# Patient Record
Sex: Female | Born: 1995 | Hispanic: Yes | Smoking: Never smoker
Health system: Southern US, Community
[De-identification: ages and names within clinical notes are randomized; demographics above are authoritative.]

## PROBLEM LIST (undated history)

## (undated) ENCOUNTER — Inpatient Hospital Stay (HOSPITAL_COMMUNITY): Payer: Self-pay

## (undated) DIAGNOSIS — Z789 Other specified health status: Secondary | ICD-10-CM

## (undated) HISTORY — PX: NO PAST SURGERIES: SHX2092

---
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
## 2017-04-26 ENCOUNTER — Encounter (HOSPITAL_COMMUNITY): Payer: Self-pay | Admitting: *Deleted

## 2017-04-26 ENCOUNTER — Inpatient Hospital Stay (HOSPITAL_COMMUNITY)
Admission: AD | Admit: 2017-04-26 | Discharge: 2017-04-28 | DRG: 807 | Disposition: A | Discharge status: Home / Self Care | Payer: Medicaid Other | Source: Ambulatory Visit | Attending: Family Medicine | Admitting: Family Medicine

## 2017-04-26 ENCOUNTER — Other Ambulatory Visit: Payer: Self-pay

## 2017-04-26 DIAGNOSIS — D649 Anemia, unspecified: Secondary | ICD-10-CM | POA: Diagnosis present

## 2017-04-26 DIAGNOSIS — O9902 Anemia complicating childbirth: Principal | ICD-10-CM | POA: Diagnosis present

## 2017-04-26 DIAGNOSIS — Z3A38 38 weeks gestation of pregnancy: Secondary | ICD-10-CM

## 2017-04-26 DIAGNOSIS — Z3483 Encounter for supervision of other normal pregnancy, third trimester: Secondary | ICD-10-CM | POA: Diagnosis present

## 2017-04-26 HISTORY — DX: Other specified health status: Z78.9

## 2017-04-26 LAB — RPR: RPR Ser Ql: NONREACTIVE

## 2017-04-26 LAB — CBC
HCT: 38.4 % (ref 36.0–46.0)
Hemoglobin: 13.2 g/dL (ref 12.0–15.0)
MCH: 29.7 pg (ref 26.0–34.0)
MCHC: 34.4 g/dL (ref 30.0–36.0)
MCV: 86.5 fL (ref 78.0–100.0)
PLATELETS: 240 10*3/uL (ref 150–400)
RBC: 4.44 MIL/uL (ref 3.87–5.11)
RDW: 13.8 % (ref 11.5–15.5)
WBC: 12.6 10*3/uL — AB (ref 4.0–10.5)

## 2017-04-26 LAB — TYPE AND SCREEN
ABO/RH(D): O POS
ANTIBODY SCREEN: NEGATIVE

## 2017-04-26 LAB — ABO/RH: ABO/RH(D): O POS

## 2017-04-26 MED ORDER — ACETAMINOPHEN 325 MG PO TABS
650.0000 mg | ORAL_TABLET | ORAL | Status: DC | PRN
Start: 2017-04-26 — End: 2017-04-26

## 2017-04-26 MED ORDER — FENTANYL CITRATE (PF) 100 MCG/2ML IJ SOLN
100.0000 ug | INTRAMUSCULAR | Status: DC | PRN
  Administered 2017-04-26 (×2): 100 ug via INTRAVENOUS
  Filled 2017-04-26 (×2): qty 2

## 2017-04-26 MED ORDER — DIPHENHYDRAMINE HCL 25 MG PO CAPS: 25.0000 mg | ORAL_CAPSULE | Freq: Four times a day (QID) | ORAL | Status: DC | PRN

## 2017-04-26 MED ORDER — OXYCODONE-ACETAMINOPHEN 5-325 MG PO TABS: 2.0000 | ORAL_TABLET | ORAL | Status: DC | PRN

## 2017-04-26 MED ORDER — SENNOSIDES-DOCUSATE SODIUM 8.6-50 MG PO TABS
2.0000 | ORAL_TABLET | ORAL | Status: DC
  Administered 2017-04-26: 2 via ORAL
  Filled 2017-04-26 (×2): qty 2

## 2017-04-26 MED ORDER — LACTATED RINGERS IV SOLN
INTRAVENOUS | Status: DC
  Administered 2017-04-26: 05:00:00 via INTRAVENOUS

## 2017-04-26 MED ORDER — BENZOCAINE-MENTHOL 20-0.5 % EX AERO
1.0000 "application " | INHALATION_SPRAY | CUTANEOUS | Status: DC | PRN
  Administered 2017-04-26: 1 via TOPICAL
  Filled 2017-04-26: qty 56

## 2017-04-26 MED ORDER — SODIUM CHLORIDE 0.9% FLUSH: 3.0000 mL | Freq: Two times a day (BID) | INTRAVENOUS | Status: DC

## 2017-04-26 MED ORDER — ONDANSETRON HCL 4 MG/2ML IJ SOLN: 4.0000 mg | Freq: Four times a day (QID) | INTRAMUSCULAR | Status: DC | PRN

## 2017-04-26 MED ORDER — ONDANSETRON HCL 4 MG/2ML IJ SOLN: 4.0000 mg | INTRAMUSCULAR | Status: DC | PRN

## 2017-04-26 MED ORDER — WITCH HAZEL-GLYCERIN EX PADS: 1.0000 "application " | MEDICATED_PAD | CUTANEOUS | Status: DC | PRN

## 2017-04-26 MED ORDER — TETANUS-DIPHTH-ACELL PERTUSSIS 5-2.5-18.5 LF-MCG/0.5 IM SUSP: 0.5000 mL | Freq: Once | INTRAMUSCULAR | Status: DC

## 2017-04-26 MED ORDER — SIMETHICONE 80 MG PO CHEW: 80.0000 mg | CHEWABLE_TABLET | ORAL | Status: DC | PRN

## 2017-04-26 MED ORDER — ACETAMINOPHEN 325 MG PO TABS: 650.0000 mg | ORAL_TABLET | ORAL | Status: DC | PRN

## 2017-04-26 MED ORDER — OXYTOCIN 40 UNITS IN LACTATED RINGERS INFUSION - SIMPLE MED
2.5000 [IU]/h | INTRAVENOUS | Status: DC
  Filled 2017-04-26: qty 1000

## 2017-04-26 MED ORDER — ONDANSETRON HCL 4 MG PO TABS: 4.0000 mg | ORAL_TABLET | ORAL | Status: DC | PRN

## 2017-04-26 MED ORDER — MEASLES, MUMPS & RUBELLA VAC ~~LOC~~ INJ
0.5000 mL | INJECTION | Freq: Once | SUBCUTANEOUS | Status: DC
  Filled 2017-04-26: qty 0.5

## 2017-04-26 MED ORDER — ZOLPIDEM TARTRATE 5 MG PO TABS: 5.0000 mg | ORAL_TABLET | Freq: Every evening | ORAL | Status: DC | PRN

## 2017-04-26 MED ORDER — LIDOCAINE HCL (PF) 1 % IJ SOLN
30.0000 mL | INTRAMUSCULAR | Status: AC | PRN
  Administered 2017-04-26: 30 mL via SUBCUTANEOUS
  Filled 2017-04-26: qty 30

## 2017-04-26 MED ORDER — OXYCODONE-ACETAMINOPHEN 5-325 MG PO TABS
1.0000 | ORAL_TABLET | ORAL | Status: DC | PRN
Start: 2017-04-26 — End: 2017-04-26

## 2017-04-26 MED ORDER — PRENATAL MULTIVITAMIN CH
1.0000 | ORAL_TABLET | Freq: Every day | ORAL | Status: DC
  Administered 2017-04-27 – 2017-04-28 (×2): 1 via ORAL
  Filled 2017-04-26 (×2): qty 1

## 2017-04-26 MED ORDER — OXYTOCIN BOLUS FROM INFUSION
500.0000 mL | Freq: Once | INTRAVENOUS | Status: AC
  Administered 2017-04-26: 500 mL via INTRAVENOUS

## 2017-04-26 MED ORDER — COCONUT OIL OIL: 1.0000 "application " | TOPICAL_OIL | Status: DC | PRN

## 2017-04-26 MED ORDER — IBUPROFEN 600 MG PO TABS
600.0000 mg | ORAL_TABLET | Freq: Four times a day (QID) | ORAL | Status: DC
  Administered 2017-04-26 – 2017-04-28 (×8): 600 mg via ORAL
  Filled 2017-04-26 (×8): qty 1

## 2017-04-26 MED ORDER — LACTATED RINGERS IV SOLN: 500.0000 mL | INTRAVENOUS | Status: DC | PRN

## 2017-04-26 MED ORDER — SODIUM CHLORIDE 0.9 % IV SOLN: 250.0000 mL | INTRAVENOUS | Status: DC | PRN

## 2017-04-26 MED ORDER — SOD CITRATE-CITRIC ACID 500-334 MG/5ML PO SOLN: 30.0000 mL | ORAL | Status: DC | PRN

## 2017-04-26 MED ORDER — SODIUM CHLORIDE 0.9% FLUSH: 3.0000 mL | INTRAVENOUS | Status: DC | PRN

## 2017-04-26 MED ORDER — FLEET ENEMA 7-19 GM/118ML RE ENEM: 1.0000 | ENEMA | RECTAL | Status: DC | PRN

## 2017-04-26 MED ORDER — DIBUCAINE 1 % RE OINT: 1.0000 "application " | TOPICAL_OINTMENT | RECTAL | Status: DC | PRN

## 2017-04-26 NOTE — H&P (Signed)
LABOR AND DELIVERY ADMISSION HISTORY AND PHYSICAL NOTE  Belinda Fisher is a 21 y.o. female G1P0 with IUP at 7051w6d by U/S presenting for SOL. Pt feeling ctx 12am. She reports positive fetal movement. She denies leakage of fluid or vaginal bleeding. Pt denies headache, blurry vision, dizziness RUQ pain or leg swelling.  Prenatal History/Complications: [facility] Pregnancy complications:  -Late PNC -Mild anemia  Past Medical History: Past Medical History:  Diagnosis Date  . Medical history non-contributory     Past Surgical History: Past Surgical History:  Procedure Laterality Date  . NO PAST SURGERIES      Obstetrical History: OB History    Gravida Para Term Preterm AB Living   1             SAB TAB Ectopic Multiple Live Births                  Social History: Social History   Socioeconomic History  . Marital status: Single    Spouse name: None  . Number of children: None  . Years of education: None  . Highest education level: None  Social Needs  . Financial resource strain: None  . Food insecurity - worry: None  . Food insecurity - inability: None  . Transportation needs - medical: None  . Transportation needs - non-medical: None  Occupational History  . None  Tobacco Use  . Smoking status: Never Smoker  . Smokeless tobacco: Never Used  Substance and Sexual Activity  . Alcohol use: No  . Drug use: No  . Sexual activity: Yes    Birth control/protection: Condom  Other Topics Concern  . 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 No pertinent family history.  Allergies: No Known Allergies  Medications Prior to Admission  Medication Sig Dispense Refill Last Dose  . ferrous sulfate 325 (65 FE) MG EC tablet Take 325 mg 3 (three) times daily with meals by mouth.   04/25/2017 at Unknown time  . Prenatal Vit-Fe Fumarate-FA  (PRENATAL MULTIVITAMIN) TABS tablet Take daily at 12 noon by mouth.        Review of Systems  All systems reviewed and negative except as stated in HPI  Physical Exam Blood pressure (!) 106/59, pulse 65, temperature (!) 97.4 F (36.3 C), resp. rate 16, height 4\' 11"  (1.499 m), weight 58.1 kg (128 lb), last menstrual period 07/20/2016. General appearance: alert, cooperative and no distress Lungs: clear to auscultation bilaterally Heart: regular rate and rhythm Abdomen: soft, non-tender; bowel sounds normal Extremities: No calf swelling or tenderness Presentation: cephalic Fetal monitoring: 135 baseline/ moderate variability/ + accels, no decels Uterine activity: irregular, every 3-5 min Dilation: 5 Effacement (%): 80 Station: -2 Exam by:: Quintella BatonJo Barham RNC  Prenatal labs: ABO, Rh: --/--/O POS (03/13 0124) Antibody:  Neg Rubella:  Immuned RPR:   Non reactive HBsAg:   Non reactive HIV:  Non reactive GC/Chlamydia: Neg GBS:  Neg 1 hr Glucola: 89 Genetic screening:  Declined Anatomy US: 22.1, EFW 504 gm 53%, anterior placenta no previa, f/u PRN   Prenatal Transfer Tool  Maternal Diabetes: No Genetic Screening: Declined Maternal Ultrasounds/Referrals: Normal Fetal Ultrasounds or other Referrals:  None Maternal Substance Abuse:  No Significant Maternal Medications:  None Significant Maternal Lab Results: Lab values include: Group B Strep negative  Results for orders placed or performed  during the hospital encounter of 04/26/17 (from the past 24 hour(s))  CBC   Collection Time: 04/26/17  5:15 AM  Result Value Ref Range   WBC 12.6 (H) 4.0 - 10.5 K/uL   RBC 4.44 3.87 - 5.11 MIL/uL   Hemoglobin 13.2 12.0 - 15.0 g/dL   HCT 40.9 81.1 - 91.4 %   MCV 86.5 78.0 - 100.0 fL   MCH 29.7 26.0 - 34.0 pg   MCHC 34.4 30.0 - 36.0 g/dL   RDW 78.2 95.6 - 21.3 %   Platelets 240 150 - 400 K/uL    There are no active problems to display for this patient.   Assessment: Belinda Fisher is a 21 y.o. G1P0 at [redacted]w[redacted]d here for SOL.   #Labor: Expectant management. Progressing normally.  #Pain: Per pt request. Wants natural birth #FWB: Cat 1 #ID:  GBS neg #MOF: breast #MOC: None, previously waned nexplanon #Circ:  No  Steffanie Rainwater 04/26/2017, 5:01 AM Medical Student  OB FELLOW HISTORY AND PHYSICAL ATTESTATION  I confirm that I have verified the information documented in the medical student's note and that I have also personally reperformed the physical exam and all medical decision making activities. I agree with above documentation and have made edits as needed.   Caryl Ada, DO OB Fellow 04/26/2017, 6:30 AM

## 2017-04-26 NOTE — Progress Notes (Signed)
To 165 via w/c

## 2017-04-26 NOTE — Progress Notes (Signed)
Dr Doroteo GlassmanPhelps updated as to repeat sve, ctx pattern, pt breathing with ctx. Will obs and hour and reck. Pt aware

## 2017-04-26 NOTE — MAU Note (Signed)
Ctxs since 12mn. No LOF. Care at [facility] but moved in with my mom Sat due to troubles with my husband

## 2017-04-26 NOTE — Progress Notes (Signed)
Dr Doroteo GlassmanPhelps notifed of pt's repeat sve and pt more uncomfortable. Will admit to BS.

## 2017-04-26 NOTE — Progress Notes (Signed)
Dr Doroteo GlassmanPhelps notified of pt's admission and status. Aware of ctx pattern, reactive fhr, sve. WIll watch and reck

## 2017-04-27 MED ORDER — LIDOCAINE HCL 1 % IJ SOLN
0.0000 mL | Freq: Once | INTRAMUSCULAR | Status: DC | PRN
  Filled 2017-04-27: qty 20

## 2017-04-27 MED ORDER — ETONOGESTREL 68 MG ~~LOC~~ IMPL
68.0000 mg | DRUG_IMPLANT | Freq: Once | SUBCUTANEOUS | Status: DC
  Filled 2017-04-27: qty 1

## 2017-04-27 NOTE — Progress Notes (Signed)
Post Partum Day 1 Subjective: no complaints, up ad lib, voiding, tolerating PO and + flatus  Objective: Blood pressure (!) 97/51, pulse 71, temperature 98 F (36.7 C), temperature source Oral, resp. rate 20, height 4\' 11"  (1.499 m), weight 58.1 kg (128 lb), last menstrual period 07/20/2016, SpO2 99 %.  Physical Exam:  General: alert, cooperative and no distress Lochia: appropriate Uterine Fundus: firm at umbilicus Incision: NA DVT Evaluation: No evidence of DVT seen on physical exam.  Recent Labs    04/26/17 0515  HGB 13.2  HCT 38.4    Assessment/Plan: Plan for discharge tomorrow, Breastfeeding and Contraception (Interested in [place]ex[facility]. Would like to discuss having it done here prior to discharege if possible). Needs assistance with finding pediatrician in [place]. [place] live with [place] in [place]. Denies plans for circumcision.    LOS: 1 day   Huel CoteBrian W [facility] 04/27/2017, 7:37 AM

## 2017-04-27 NOTE — Lactation Note (Addendum)
This note was copied from a baby's chart. Lactation Consultation Note  Patient Name: Belinda Fisher ZOXWR'UToday's Date: 04/27/2017 Reason for consult: Initial assessment   P1, Baby 22 hours old.   Mother's nipples invert when compressed.  Mother is able to easily express flow of colostrum. She recently pumped approx 6 ml of colostrum.  Discussed milk storage. Had mother prepump.  Attempted latching in football hold on L breast but baby did not achieve sufficient depth and did not suck. Mother applied #20NS.  LC prefilled with colostrum and baby began to suck.  Repeated when baby unlatched. Demonstrated how to give the remaining colostrum via finger syringe. Repositioned baby on R breast supporting with pillows.  Attempted first without NS but then mother applied #20NS. Baby sustained latch for approx 35 min with intermittent sucks and swallows. Encouraged mother to compress breast during feeding. Mom made aware of O/P services, breastfeeding support groups, community resources, and our phone # for post-discharge questions.    Plan is for mother to prepump and attempt latching.  If he does not latch, apply NS.  Prefill if breastmilk available. Post pump 4-5 times per day for 10-20 min.  Give volume back to baby at next feeding. Mom encouraged to feed baby 8-12 times/24 hours and with feeding cues. Unwrap and place STS after 3 hours of not feeding. Mother plans to continue to wear shells when not breastfeeding     Maternal Data Has patient been taught Hand Expression?: Yes Does the patient have breastfeeding experience prior to this delivery?: No  Feeding Feeding Type: Breast Fed Length of feed: 4 min(no sustained latch)  LATCH Score Latch: Repeated attempts needed to sustain latch, nipple held in mouth throughout feeding, stimulation needed to elicit sucking reflex.  Audible Swallowing: A few with stimulation  Type of Nipple: Flat  Comfort (Breast/Nipple): Soft /  non-tender  Hold (Positioning): Assistance needed to correctly position infant at breast and maintain latch.  LATCH Score: 6  Interventions Interventions: Breast feeding basics reviewed;Assisted with latch;Skin to skin;Hand express;Pre-pump if needed;Breast compression;Support pillows;Adjust position;Expressed milk;Hand pump;DEBP  Lactation Tools Discussed/Used Tools: Nipple Shields;Pump;Shells Nipple shield size: 20 Shell Type: Inverted Breast pump type: Double-Electric Breast Pump;Manual Pump Review: Setup, frequency, and cleaning;Milk Storage Initiated by:: [email or web address] Date initiated:: 04/27/17   Consult Status Consult Status: Follow-up Date: 04/28/17 Follow-up type: In-patient    Belinda Fisher, Belinda Fisher Belinda Fisher 04/27/2017, 10:27 AM

## 2017-04-28 ENCOUNTER — Encounter (HOSPITAL_COMMUNITY): Payer: Self-pay | Admitting: *Deleted

## 2017-04-28 MED ORDER — IBUPROFEN 600 MG PO TABS: 600.0000 mg | ORAL_TABLET | Freq: Four times a day (QID) | ORAL | 0 refills | Status: DC

## 2017-04-28 NOTE — Discharge Instructions (Signed)
Postpartum Care After Vaginal Delivery °The period of time right after you deliver your newborn is called the postpartum period. °What kind of medical care will I receive? °· You may continue to receive fluids and medicines through an IV tube inserted into one of your veins. °· If an incision was made near your vagina (episiotomy) or if you had some vaginal tearing during delivery, cold compresses may be placed on your episiotomy or your tear. This helps to reduce pain and swelling. °· You may be given a squirt bottle to use when you go to the bathroom. You may use this until you are comfortable wiping as usual. To use the squirt bottle, follow these steps: °? Before you urinate, fill the squirt bottle with warm water. Do not use hot water. °? After you urinate, while you are sitting on the toilet, use the squirt bottle to rinse the area around your urethra and vaginal opening. This rinses away any urine and blood. °? You may do this instead of wiping. As you start healing, you may use the squirt bottle before wiping yourself. Make sure to wipe gently. °? Fill the squirt bottle with clean water every time you use the bathroom. °· You will be given sanitary pads to wear. °How can I expect to feel? °· You may not feel the need to urinate for several hours after delivery. °· You will have some soreness and pain in your abdomen and vagina. °· If you are breastfeeding, you may have uterine contractions every time you breastfeed for up to several weeks postpartum. Uterine contractions help your uterus return to its normal size. °· It is normal to have vaginal bleeding (lochia) after delivery. The amount and appearance of lochia is often similar to a menstrual period in the first week after delivery. It will gradually decrease over the next few weeks to a dry, yellow-brown discharge. For most women, lochia stops completely by 6-8 weeks after delivery. Vaginal bleeding can vary from woman to woman. °· Within the first few  days after delivery, you may have breast engorgement. This is when your breasts feel heavy, full, and uncomfortable. Your breasts may also throb and feel hard, tightly stretched, warm, and tender. After this occurs, you may have milk leaking from your breasts. Your health care provider can help you relieve discomfort due to breast engorgement. Breast engorgement should go away within a few days. °· You may feel more sad or worried than normal due to hormonal changes after delivery. These feelings should not last more than a few days. If these feelings do not go away after several days, speak with your health care provider. °How should I care for myself? °· Tell your health care provider if you have pain or discomfort. °· Drink enough water to keep your urine clear or pale yellow. °· Wash your hands thoroughly with soap and water for at least 20 seconds after changing your sanitary pads, after using the toilet, and before holding or feeding your baby. °· If you are not breastfeeding, avoid touching your breasts a lot. Doing this can make your breasts produce more milk. °· If you become weak or lightheaded, or you feel like you might faint, ask for help before: °? Getting out of bed. °? Showering. °· Change your sanitary pads frequently. Watch for any changes in your flow, such as a sudden increase in volume, a change in color, the passing of large blood clots. If you pass a blood clot from your vagina, save it   to show to your health care provider. Do not flush blood clots down the toilet without having your health care provider look at them. °· Make sure that all your vaccinations are up to date. This can help protect you and your baby from getting certain diseases. You may need to have immunizations done before you leave the hospital. °· If desired, talk with your health care provider about methods of family planning or birth control (contraception). °How can I start bonding with my baby? °Spending as much time as  possible with your baby is very important. During this time, you and your baby can get to know each other and develop a bond. Having your baby stay with you in your room (rooming in) can give you time to get to know your baby. Rooming in can also help you become comfortable caring for your baby. Breastfeeding can also help you bond with your baby. °How can I plan for returning home with my baby? °· Make sure that you have a car seat installed in your vehicle. °? Your car seat should be checked by a certified car seat installer to make sure that it is installed safely. °? Make sure that your baby fits into the car seat safely. °· Ask your health care provider any questions you have about caring for yourself or your baby. Make sure that you are able to contact your health care provider with any questions after leaving the hospital. °This information is not intended to replace advice given to you by your health care provider. Make sure you discuss any questions you have with your health care provider. °Document Released: 04/06/2007 Document Revised: 11/12/2015 Document Reviewed: 05/14/2015 °Elsevier Interactive Patient Education © 2018 Elsevier Inc. ° ° °Contraception Choices °Contraception, also called birth control, means things to use or ways to try not to get pregnant. °Hormonal birth control °This kind of birth control uses hormones. Here are some types of hormonal birth control: °· A tube that is put under skin of the arm (implant). The tube can stay in for as long as 3 years. °· Shots to get every 3 months (injections). °· Pills to take every day (birth control pills). °· A patch to change 1 time each week for 3 weeks (birth control patch). After that, the patch is taken off for 1 week. °· A ring to put in the vagina. The ring is left in for 3 weeks. Then it is taken out of the vagina for 1 week. Then a new ring is put in. °· Pills to take after unprotected sex (emergency birth control pills). ° °Barrier birth  control °Here are some types of barrier birth control: °· A thin covering that is put on the penis before sex (female condom). The covering is thrown away after sex. °· A soft, loose covering that is put in the vagina before sex (female condom). The covering is thrown away after sex. °· A rubber bowl that sits over the cervix (diaphragm). The bowl must be made for you. The bowl is put into the vagina before sex. The bowl is left in for 6-8 hours after sex. It is taken out within 24 hours. °· A small, soft cup that fits over the cervix (cervical cap). The cup must be made for you. The cup can be left in for 6-8 hours after sex. It is taken out within 48 hours. °· A sponge that is put into the vagina before sex. It must be left in for at least 6   hours after sex. It must be taken out within 30 hours. Then it is thrown away.  A chemical that kills or stops sperm from getting into the uterus (spermicide). It may be a pill, cream, jelly, or foam to put in the vagina. The chemical should be used at least 10-15 minutes before sex.  IUD (intrauterine) birth control An IUD is a small, T-shaped piece of plastic. It is put inside the uterus. There are two kinds:  Hormone IUD. This kind can stay in for 3-5 years.  Copper IUD. This kind can stay in for 10 years.  Permanent birth control Here are some types of permanent birth control:  Surgery to block the fallopian tubes.  Having an insert put into each fallopian tube.  Surgery to tie off the tubes that carry sperm (vasectomy).  Natural planning birth control Here are some types of natural planning birth control:  Not having sex on the days the woman could get pregnant.  Using a calendar: ? To keep track of the length of each period. ? To find out what days pregnancy can happen. ? To plan to not have sex on days when pregnancy can happen.  Watching for symptoms of ovulation and not having sex during ovulation. One way the woman can check for ovulation  is to check her temperature.  Waiting to have sex until after ovulation.  Summary  Contraception, also called birth control, means things to use or ways to try not to get pregnant.  Hormonal methods of birth control include implants, injections, pills, patches, vaginal rings, and emergency birth control pills.  Barrier methods of birth control can include female condoms, female condoms, diaphragms, cervical caps, sponges, and spermicides.  There are two types of IUD (intrauterine device) birth control. An IUD can be put in a woman's uterus to prevent pregnancy for 3-5 years.  Permanent sterilization can be done through a procedure for males, females, or both.  Natural planning methods involve not having sex on the days when the woman could get pregnant. This information is not intended to replace advice given to you by your health care provider. Make sure you discuss any questions you have with your health care provider. Document Released: 04/06/2009 Document Revised: 06/19/2016 Document Reviewed: 06/19/2016 Elsevier Interactive Patient Education  2017 [employer]

## 2017-04-28 NOTE — Discharge Summary (Signed)
OB Discharge Summary     Patient Name: Belinda Fisher DOB: 06/20/1996 MRN: [id number]  Date of admission: 04/26/2017 Delivering MD: Wyvonnia DuskyLAWSON, MARIE D   Date of discharge: 04/28/2017  Admitting diagnosis: ctx every 5 min  Intrauterine pregnancy: 5166w1d     Secondary diagnosis:  Principal Problem:   SVD (spontaneous vaginal delivery) Active Problems:   Normal labor  Additional problems: none     Discharge diagnosis: Term Pregnancy Delivered                                                                                                Post partum procedures:none  Augmentation: AROM  Complications: None  Hospital course:  Onset of Labor With Vaginal Delivery     21 y.o. yo G1P0 at 5666w1d was admitted in Active Labor on 04/26/2017. Patient had an uncomplicated labor course as follows:  Membrane Rupture Time/Date: 7:32 AM ,04/26/2017   Intrapartum Procedures: Episiotomy: None [1]                                         Lacerations:  2nd degree [3];Perineal [11]  Patient had a delivery of a Viable infant. 04/26/2017  Information for the patient's newborn:  Belinda Fisher, Boy [name] [[id number]][[id number]]  Delivery Method: Vaginal, Spontaneous(Filed from Delivery Summary)    Pateint had an uncomplicated postpartum course.  She is ambulating, tolerating a regular diet, passing flatus, and urinating well. Patient is discharged home in stable condition on 04/28/17.   Physical exam  Vitals:   04/26/17 1800 04/27/17 0630 04/27/17 1840 04/28/17 0618  BP: (!) 93/51 (!) 97/51 (!) 104/55 (!) 93/54  Pulse: 71 71  70  Resp: 18 20 18 18   Temp: 98.2 F (36.8 C) 98 F (36.7 C) 98.6 F (37 C) 97.8 F (36.6 C)  TempSrc: Oral Oral Oral Oral  SpO2: 99%  98%   Weight:      Height:       General: alert, cooperative and no distress Lochia: appropriate Uterine Fundus: firm Incision: N/A DVT Evaluation: No evidence of DVT seen on physical exam. Labs: Lab Results  Component Value Date   WBC  12.6 (H) 04/26/2017   HGB 13.2 04/26/2017   HCT 38.4 04/26/2017   MCV 86.5 04/26/2017   PLT 240 04/26/2017   CMP Latest Ref Rng & Units 09/01/2016  Glucose 65 - 99 mg/dL 93  BUN 6 - 20 mg/dL 12  Creatinine 2.440.44 - 0.101.00 mg/dL 2.720.62  Sodium 536135 - 644145 mmol/L 136  Potassium 3.5 - 5.1 mmol/L 4.1  Chloride 101 - 111 mmol/L 105  CO2 22 - 32 mmol/L 24  Calcium 8.9 - 10.3 mg/dL 8.9  Total Protein 6.5 - 8.1 g/dL 6.8  Total Bilirubin 0.3 - 1.2 mg/dL 0.4  Alkaline Phos 38 - 126 U/L 76  AST 15 - 41 U/L 20  ALT 14 - 54 U/L 15    Discharge instruction: per After Visit Summary and "Baby and Me Booklet".  After visit meds:  Allergies as of 04/28/2017   No Known Allergies     Medication List    STOP taking these medications   ferrous sulfate 325 (65 FE) MG EC tablet     TAKE these medications   ibuprofen 600 MG tablet Commonly known as:  ADVIL,MOTRIN Take 1 tablet (600 mg total) every 6 (six) hours by mouth.   prenatal multivitamin Tabs tablet Take daily at 12 noon by mouth.       Diet: routine diet  Activity: Advance as tolerated. Pelvic rest for 6 weeks.   Outpatient follow up: 4 weeks postpartum [facility], but would like to follow up with us. [facility]: Please schedule this patient for PP visit in: 4 weeks Low risk pregnancy complicated by: teen pregnancy Delivery mode:  SVD Anticipated Birth Control:  other/unsure PP Procedures needed: none  Schedule Integrated BH visit: no Provider: any provider   Postpartum contraception: None -- counseled extensively and handouts given.   Newborn Data: Live born female  Birth Weight: 7 lb 3.2 oz (3265 g) APGAR: 9, 9  Newborn Delivery   Birth date/time:  04/26/2017 10:59:00 Delivery type:  Vaginal, Spontaneous     Baby Feeding: Breast Disposition:home with mother   04/28/2017 Belinda PearJulie P Degele, MD

## 2017-04-28 NOTE — Lactation Note (Signed)
This note was copied from a baby's chart. Lactation Consultation Note  Patient Name: Belinda Fisher ZOXWR'UToday's Date: 04/28/2017 Reason for consult: Follow-up assessment;Other (Comment)(WIC rep in the room with LC has  DEBP ) LC has visited mom x2 this am. Both times has fed in the 30 - 60 mins [facility] to [facility] , unable to assess feeding.  Per mom feels comfortable with application of the Nipple Shield and the pumping. Baby recently fed and mom has pumped Off earlier 30 ml.  [facility] using [facility] and the importance of feeding the abby with feeding cues and using a nipple Shield For latch due to Flat nipples, also encouraged the use of breast shells between feedings, and extra post pumping.  Mom will have a hand pump and LC suggested if to full to hand express or use the hand pump to release so NS will fit properly.  Post pump after every feeding for 10 - 15 mins. Save milk.  LC discussed nutritive vs non- nutritive feeding patterns and the importance of STS feedings until the baby can stay awake for a feeding  And the weight is back up to birth weight.  Sore nipple and engorgement prevention and tx reviewed with mom. And the importance of softening the 1st breast well prior to offering the  2nd breast. Mom will have a hand pump and a DEBP [facility].  LC offered mom and LC O/P appt. And recommended returning for appt. This Thursday or Friday and the OB / GYN clinic would call mom  To set up the appt. And the [facility] days.    Maternal Data    Feeding Feeding Type: (per mom baby recently breast fed ) Length of feed: 30 min  LATCH Score                   Interventions Interventions: Breast feeding basics reviewed  Lactation Tools Discussed/Used Tools: Nipple Shields Nipple shield size: 20 Shell Type: Inverted Breast pump type: Double-Electric Breast Pump;Manual WIC Program: Yes(per mom Belinda Fisher - [facility] [employer]  , )   Consult Status Consult Status: Follow-up Date: (LC placed a LC O/P request for LC appt. for this Thurday or Friday ) Follow-up type: Out-patient    Belinda SprangMargaret Ann Jalexa Fisher 04/28/2017, 12:34 PM

## 2017-04-28 NOTE — Progress Notes (Signed)
CSW received call from bedside RN stating MOB wanted to speak with the CSW, but is preparing for discharge.  CSW called MOB to ensure that if there are resources MOB is requesting, that CSW has them available to [facility] so [facility] is not delayed.  MOB informed CSW that she has questions about her baby's birth certificate.  She also states that while she does not feel she needs to discuss her situation today, she is requesting resources for counseling in order to talk about divorcing her husband.   CSW called C. Malloy/Birth Registrar to request that she speak with MOB.  CSW provided [facility] with [facility].  CSW encouraged her to monitor her emotions by using the [place] Post[place] Depression Scale (score of 7 in the hospital) and the New Mom Checklist from Postpartum Progress.  MOB agreed, states no further questions or needs, and thanked CSW.

## 2017-06-08 ENCOUNTER — Encounter: Payer: Self-pay | Admitting: Family Medicine

## 2017-06-08 ENCOUNTER — Encounter: Payer: Self-pay | Admitting: Obstetrics and Gynecology

## 2017-06-08 ENCOUNTER — Ambulatory Visit (INDEPENDENT_AMBULATORY_CARE_PROVIDER_SITE_OTHER): Payer: Self-pay | Admitting: Obstetrics and Gynecology

## 2017-06-08 NOTE — Progress Notes (Signed)
Subjective:     Belinda Fisher is a 21 y.o. female who presents for a postpartum visit. She is 6 weeks postpartum following a spontaneous vaginal delivery. I have fully reviewed the prenatal and intrapartum course. The delivery was at 39 gestational weeks. Outcome: spontaneous vaginal delivery. Anesthesia: local. Postpartum course has been uncomplicated. Baby's course has been uncomplicated. Baby is feeding by breast. Bleeding no bleeding. Bowel function is normal. Bladder function is normal. Patient is not sexually active. Contraception method is none. Postpartum depression screening: negative.  The following portions of the patient's history were reviewed and updated as appropriate: allergies, current medications, past family history, past medical history, past social history, past surgical history and problem list.  Review of Systems Pertinent items are noted in HPI.   Objective:    BP 101/62 (BP Location: Left Arm, Patient Position: Sitting, Cuff Size: Normal)   Pulse 63   Wt 108 lb 11.2 oz (49.3 kg)   Breastfeeding? Yes   BMI 21.95 kg/m    General:  alert, cooperative and appears stated age   Breasts:  inspection negative, no nipple discharge or bleeding, no masses or nodularity palpable  Lungs: clear to auscultation bilaterally  Heart:  regular rate and rhythm, S1, S2 normal, no murmur, click, rub or gallop  Abdomen: soft, non-tender; bowel sounds normal; no masses,  no organomegaly   Assessment:   Normal postpartum exam. Pap smear not done at today's visit. Patient <21  Plan:   1. Contraception: none 2. Desires LARC, message sent to [facility] for [facility].  3. Follow up as needed  Venia Carbonasch, Jennifer I, NP 06/12/2017 4:22 PM

## 2017-06-08 NOTE — Patient Instructions (Addendum)
You have constipation which is hard stools that are difficult to pass. It is important to have regular bowel movements every 1-3 days that are soft and easy to pass. Hard stools increase your risk of hemorrhoids and are very uncomfortable.   To prevent constipation you can increase the amount of fiber in your diet. Examples of foods with fiber are leafy greens, whole grain breads, oatmeal and other grains.  It is also important to drink at least eight 8oz glass of water everyday.   If you have not has a bowel movement in 4-5 days you made need to clean out your bowel.  This will have establish normal movement through your bowel.    Miralax Clean out  Take 8 capfuls of miralax in 64 oz of gatorade. You can use any fluid that appeals to you (gatorade, water, juice)  Continue to drink at least eight 8 oz glasses of water throughout the day  You can repeat with another 8 capfuls of miralax in 64 oz of gatorade if you are not having a large amount of stools  You will need to be at home and close to a bathroom for about 8 hours when you do the above as you may need to go to the bathroom frequently.   After you are cleaned out: - Start Colace100mg  twice daily - Start Miralax once daily - Start a daily fiber supplement like metamucil or citrucel - You can safely use enemas in pregnancy  - if you are having diarrhea you can reduce to Colace once a day or miralax every other day or a 1/2 capful daily.     [facility]  [phone]) [phone]

## 2017-06-19 ENCOUNTER — Encounter: Payer: Self-pay | Admitting: Family

## 2017-06-19 ENCOUNTER — Ambulatory Visit (INDEPENDENT_AMBULATORY_CARE_PROVIDER_SITE_OTHER): Payer: Self-pay | Admitting: Family

## 2017-06-19 VITALS — BP 99/64 | HR 66 | Ht 59.45 in | Wt 107.0 lb

## 2017-06-19 DIAGNOSIS — Z113 Encounter for screening for infections with a predominantly sexual mode of transmission: Secondary | ICD-10-CM

## 2017-06-19 DIAGNOSIS — Z30017 Encounter for initial prescription of implantable subdermal contraceptive: Secondary | ICD-10-CM

## 2017-06-19 DIAGNOSIS — Z3202 Encounter for pregnancy test, result negative: Secondary | ICD-10-CM

## 2017-06-19 LAB — POCT URINE PREGNANCY: Preg Test, Ur: NEGATIVE

## 2017-06-19 MED ORDER — ETONOGESTREL 68 MG ~~LOC~~ IMPL
68.0000 mg | DRUG_IMPLANT | Freq: Once | SUBCUTANEOUS | Status: AC
  Administered 2017-06-19: 68 mg via SUBCUTANEOUS

## 2017-06-19 NOTE — Procedures (Signed)
Nexplanon Insertion  No contraindications for placement.  No liver disease, no unexplained vaginal bleeding, no h/o breast cancer, no h/o blood clots.  No LMP recorded.  UHCG: negative  Last Unprotected sex:  NA  Risks & benefits of Nexplanon discussed The nexplanon device was purchased and supplied by [facility]. Packaging instructions supplied to patient Consent form signed  The patient denies any allergies to anesthetics or antiseptics.  Procedure: Pt was placed in supine position. The left arm was flexed at the elbow and externally rotated so that her wrist was parallel to her ear The medial epicondyle of the left arm was identified The insertions site was marked 8 cm proximal to the medial epicondyle The insertion site was cleaned with Betadine The area surrounding the insertion site was covered with a sterile drape 1% lidocaine was injected just under the skin at the insertion site extending 4 cm proximally. The sterile preloaded disposable Nexaplanon applicator was removed from the sterile packaging The applicator needle was inserted at a 30 degree angle at 8 cm proximal to the medial epicondyle as marked The applicator was lowered to a horizontal position and advanced just under the skin for the full length of the needle The slider on the applicator was retracted fully while the applicator remained in the same position, then the applicator was removed. The implant was confirmed via palpation as being in position The implant position was demonstrated to the patient Pressure dressing was applied to the patient.  The patient was instructed to removed the pressure dressing in 24 hrs.  The patient was advised to move slowly from a supine to an upright position  The patient denied any concerns or complaints  The patient was instructed to schedule a follow-up appt in 1 month and to call sooner if any concerns.  The patient acknowledged agreement and understanding of the plan.  

## 2017-06-19 NOTE — Progress Notes (Signed)
History was provided by the patient.  Belinda MoloneyFabiola Lelan PonsReyna Fisher is a 21 y.o. female G1P1 here for nexplanon insertion.  Patient, No Pcp Per   HPI:  Pt reports understanding of contraception options and was told to come to our clinic at her postpartum visit on 06/08/17 for a nexplanon. She is currently breastfeeding, otherwise negative medical history. SVD.  Pertinent negatives include postpartum depression screening per check-up note; not currently sexually active.   No LMP recorded.  Review of Systems  Constitutional: Negative for malaise/fatigue.  Eyes: Negative for double vision.  Respiratory: Negative for shortness of breath.   Cardiovascular: Negative for chest pain and palpitations.  Gastrointestinal: Negative for abdominal pain, constipation, diarrhea, nausea and vomiting.  Genitourinary: Negative for dysuria.  Musculoskeletal: Negative for joint pain and myalgias.  Skin: Negative for rash.  Neurological: Negative for dizziness and headaches.  Endo/Heme/Allergies: Does not bruise/bleed easily.     Patient Active Problem List   Diagnosis Date Noted  . Postpartum care and examination 06/12/2017  . SVD (spontaneous vaginal delivery) 04/28/2017  . Normal labor 04/26/2017    Current Outpatient Medications on File Prior to Visit  Medication Sig Dispense Refill  . ibuprofen (ADVIL,MOTRIN) 600 MG tablet Take 1 tablet (600 mg total) every 6 (six) hours by mouth. (Patient not taking: Reported on 06/08/2017) 60 tablet 0  . Prenatal Vit-Fe Fumarate-FA (PRENATAL MULTIVITAMIN) TABS tablet Take daily at 12 noon by mouth.     No current facility-administered medications on file prior to visit.     No Known Allergies  Physical Exam:    Vitals:   06/19/17 1038  BP: 99/64  Pulse: 66  Weight: 107 lb (48.5 kg)  Height: 4' 11.45" (1.51 m)    Blood pressure percentiles are 11 % systolic and 47 % diastolic based on the August 2017 AAP Clinical Practice Guideline.  Physical Exam   Constitutional: She appears well-nourished. No distress.  Cardiovascular: Normal rate.  Pulmonary/Chest: Effort normal.  Musculoskeletal: Normal range of motion. She exhibits no edema or tenderness.  Neurological: She is alert.  Skin: Skin is warm. No rash noted.  Psychiatric: She has a normal mood and affect.   Assessment/Plan: 1. Encounter for initial prescription of Nexplanon -see procedure note - Subdermal Etonogestrel Implant Insertion  2. Pregnancy examination or test, negative result negative - POCT urine pregnancy  3. Routine screening for STI (sexually transmitted infection) Per protocol - C. trachomatis/N. gonorrhoeae RNA

## 2017-06-20 LAB — C. TRACHOMATIS/N. GONORRHOEAE RNA
C. trachomatis RNA, TMA: NOT DETECTED
N. gonorrhoeae RNA, TMA: NOT DETECTED

## 2017-09-03 ENCOUNTER — Ambulatory Visit: Payer: Self-pay | Admitting: Pediatrics

## 2018-06-28 ENCOUNTER — Ambulatory Visit (INDEPENDENT_AMBULATORY_CARE_PROVIDER_SITE_OTHER): Payer: Self-pay | Admitting: Pediatrics

## 2018-06-28 ENCOUNTER — Encounter: Payer: Self-pay | Admitting: Pediatrics

## 2018-06-28 VITALS — BP 106/63 | HR 65 | Ht 59.45 in | Wt 101.4 lb

## 2018-06-28 DIAGNOSIS — Z3202 Encounter for pregnancy test, result negative: Secondary | ICD-10-CM

## 2018-06-28 DIAGNOSIS — Z113 Encounter for screening for infections with a predominantly sexual mode of transmission: Secondary | ICD-10-CM

## 2018-06-28 DIAGNOSIS — Z3046 Encounter for surveillance of implantable subdermal contraceptive: Secondary | ICD-10-CM

## 2018-06-28 LAB — POCT URINE PREGNANCY: Preg Test, Ur: NEGATIVE

## 2018-06-28 NOTE — Progress Notes (Signed)
[phone] confidential number

## 2018-06-28 NOTE — Progress Notes (Signed)
History was provided by the patient.  Belinda Fisher is a 23 y.o. female who is here for follow up.  Patient, No Pcp Per   HPI:  Pt reports that she is having some brownish bloody discharge and wants to make sure that she is ok. She had one month of a period when she first got a nexplanon and is just now getting another one.   Sexually active, same partner. Never condoms. No pain or bleeding with sex and no other vaginal discharge or odor.   No LMP recorded.  Review of Systems  Constitutional: Negative for malaise/fatigue.  Eyes: Negative for double vision.  Respiratory: Negative for shortness of breath.   Cardiovascular: Negative for chest pain and palpitations.  Gastrointestinal: Negative for abdominal pain, constipation, diarrhea, nausea and vomiting.  Genitourinary: Negative for dysuria.  Musculoskeletal: Negative for joint pain and myalgias.  Skin: Negative for rash.  Neurological: Negative for dizziness and headaches.  Endo/Heme/Allergies: Does not bruise/bleed easily.  Psychiatric/Behavioral: Negative for depression. The patient is not nervous/anxious.     There are no active problems to display for this patient.   Current Outpatient Medications on File Prior to Visit  Medication Sig Dispense Refill  . Prenatal Vit-Fe Fumarate-FA (PRENATAL MULTIVITAMIN) TABS tablet Take daily at 12 noon by mouth.     No current facility-administered medications on file prior to visit.     No Known Allergies   Physical Exam:    Vitals:   06/28/18 1521  BP: 106/63  Pulse: 65  Weight: 101 lb 6.4 oz (46 kg)  Height: 4' 11.45" (1.51 m)    Growth percentile SmartLinks can only be used for patients less than 54 years old.  Physical Exam Vitals signs and nursing note reviewed.  Constitutional:      General: She is not in acute distress.    Appearance: She is well-developed.  Neck:     Thyroid: No thyromegaly.  Cardiovascular:     Rate and Rhythm: Normal rate and  regular rhythm.     Heart sounds: No murmur.  Pulmonary:     Breath sounds: Normal breath sounds.  Abdominal:     Palpations: Abdomen is soft. There is no mass.     Tenderness: There is no abdominal tenderness. There is no guarding.  Lymphadenopathy:     Cervical: No cervical adenopathy.  Skin:    General: Skin is warm.     Findings: No rash.  Neurological:     Mental Status: She is alert.  Psychiatric:        Mood and Affect: Mood normal.     Assessment/Plan: 1. Encounter for surveillance of Nexplanon subdermal contraceptive Doing well with product and happy. Having some brown discharge which is likely just intermittent bleeding from the product but I reassured her today and we will ensure no other infectious causes. She was in agreement. Reminded her of removal date.   2. Routine screening for STI (sexually transmitted infection) As above.  - WET PREP BY MOLECULAR PROBE - C. trachomatis/N. gonorrhoeae RNA  3. Pregnancy examination or test, negative result Patient requested for reassurance- negative.  - POCT urine pregnancy

## 2018-06-28 NOTE — Patient Instructions (Signed)
06/19/2020 for removal  We will do labs today to ensure no other infections

## 2018-06-29 LAB — C. TRACHOMATIS/N. GONORRHOEAE RNA
C. TRACHOMATIS RNA, TMA: NOT DETECTED
N. GONORRHOEAE RNA, TMA: NOT DETECTED

## 2018-06-29 LAB — WET PREP BY MOLECULAR PROBE
CANDIDA SPECIES: NOT DETECTED
Gardnerella vaginalis: NOT DETECTED
MICRO NUMBER:: 15794
SPECIMEN QUALITY:: ADEQUATE
Trichomonas vaginosis: NOT DETECTED

## 2018-10-17 IMAGING — US US OB TRANSVAGINAL
1 series · 15 of 28 positions shown · non-contrast
Comparison: 09/01/2016

CLINICAL DATA: Pregnant, inconclusive viability

EXAM:
TRANSVAGINAL OB ULTRASOUND
TECHNIQUE: Transvaginal ultrasound was performed for complete evaluation of the
gestation as well as the maternal uterus, adnexal regions, and
pelvic cul-de-sac.

[Series 1: us ob transvaginal · 15 of 61 slices shown]
[im 1/61]
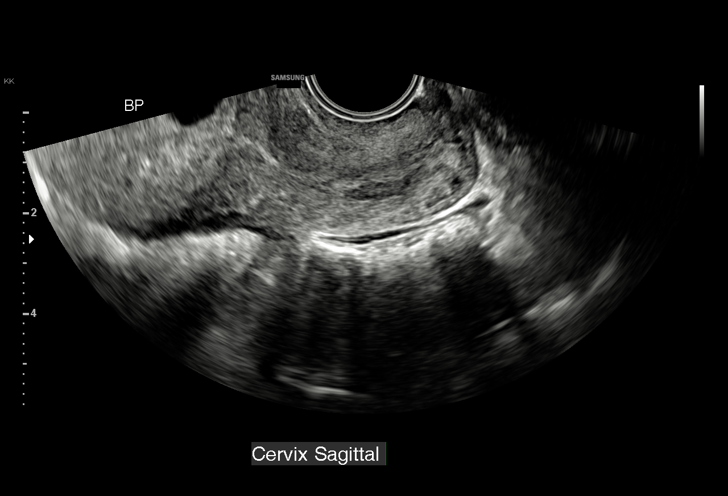
[im 5/61]
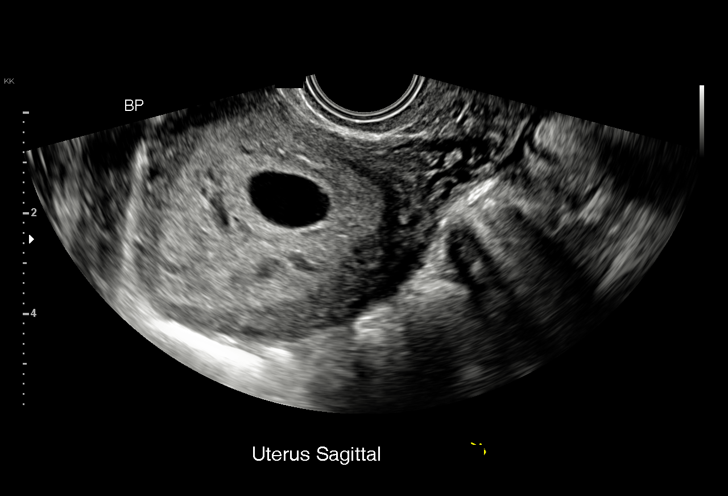
[im 9/61]
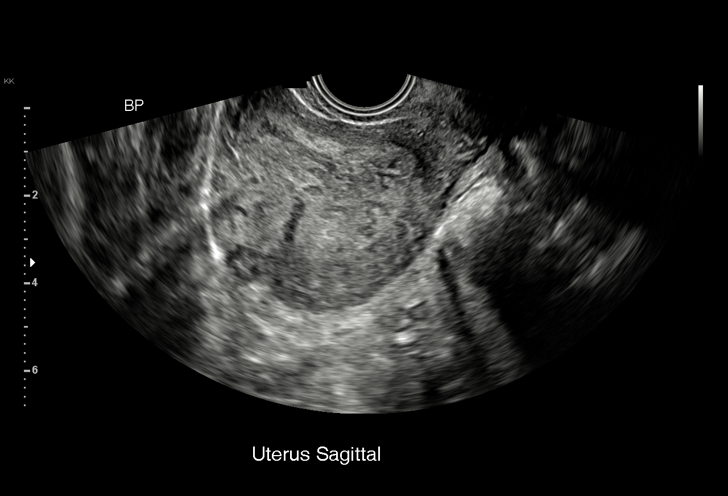
[im 14/61]
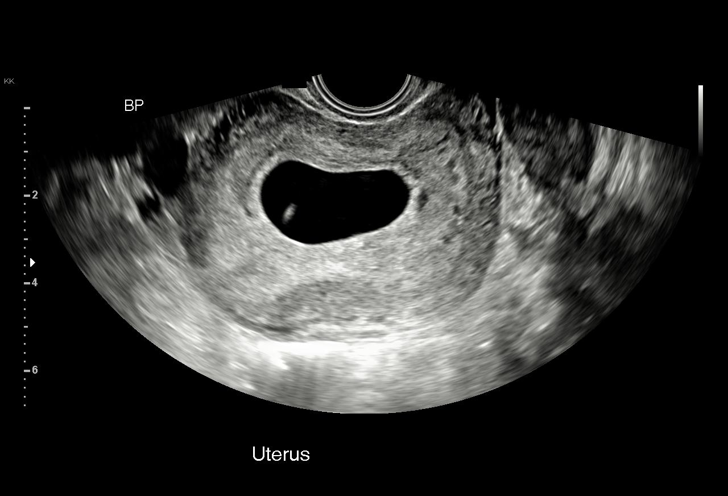
[im 18/61]
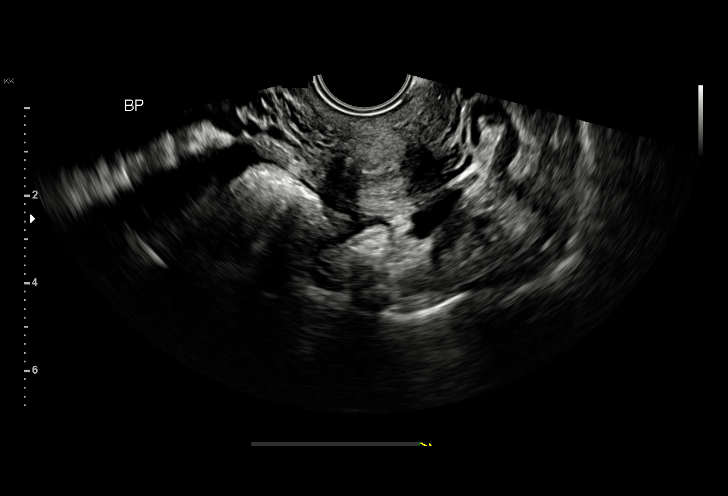
[im 23/61]
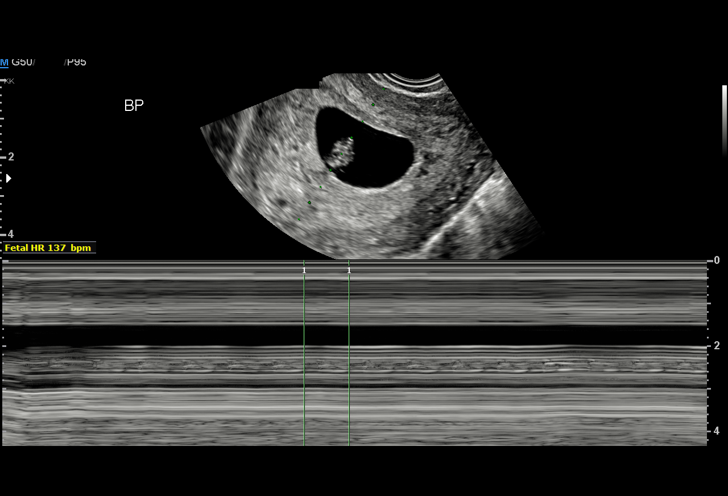
[im 27/61]
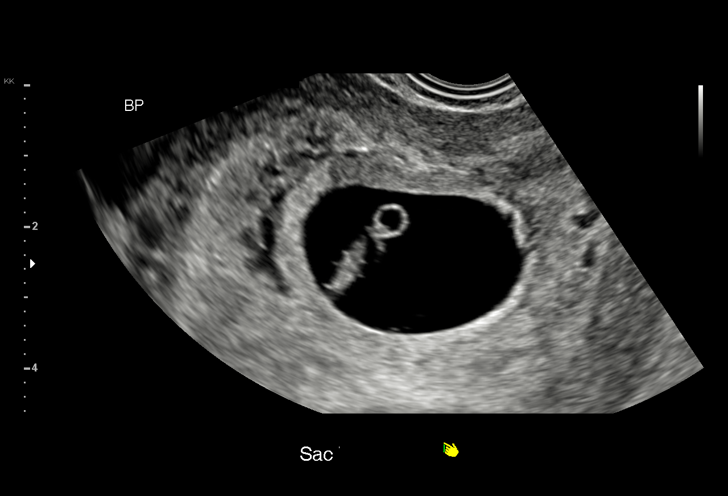
[im 32/61]
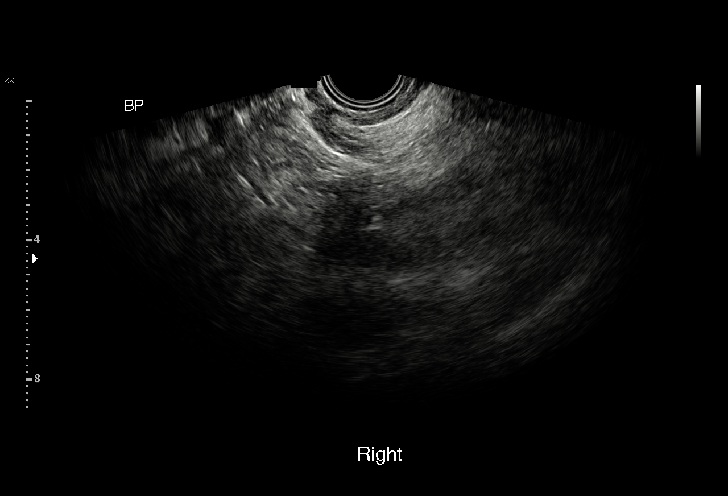
[im 34/61]
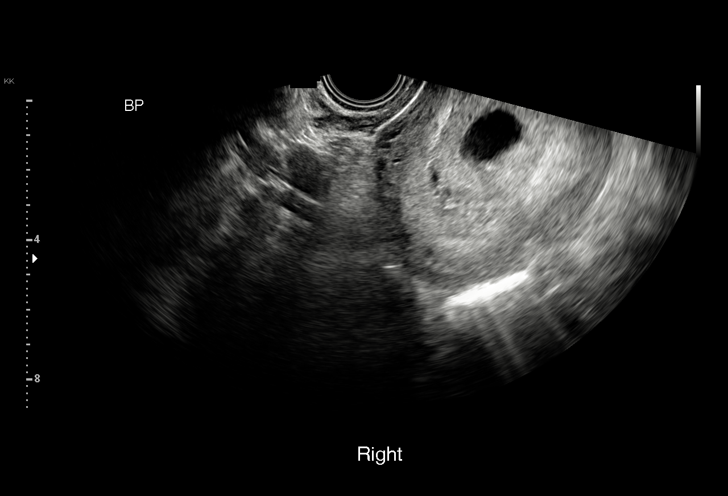
[im 38/61]
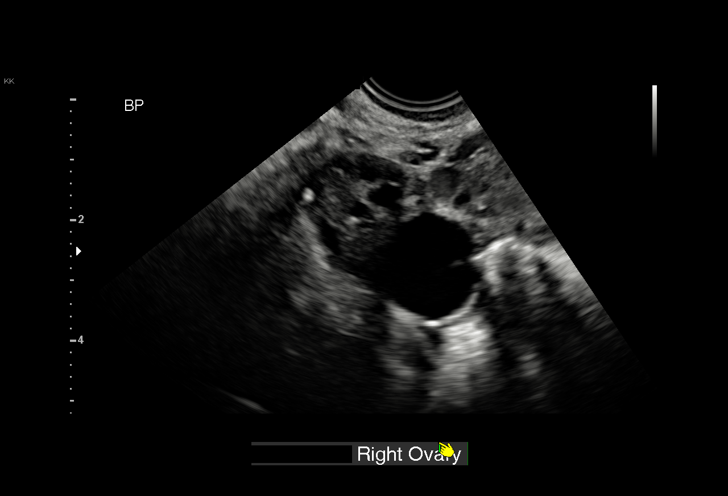
[im 43/61]
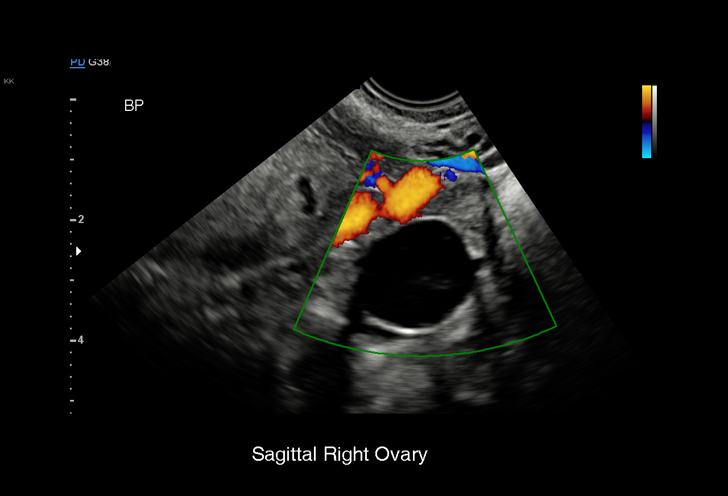
[im 47/61]
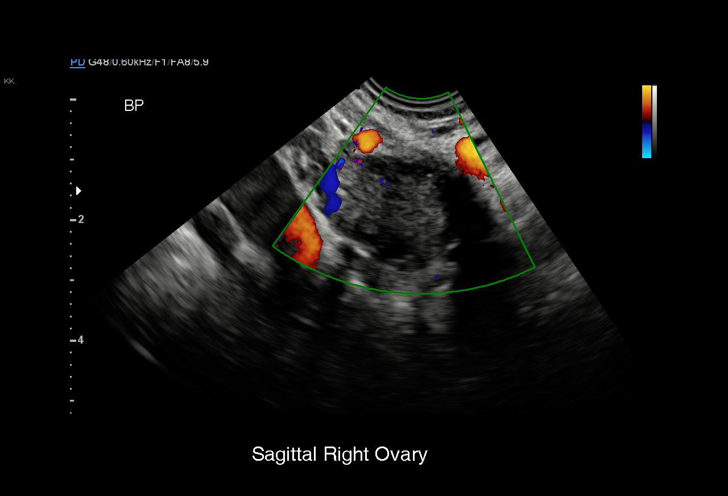
[im 52/61]
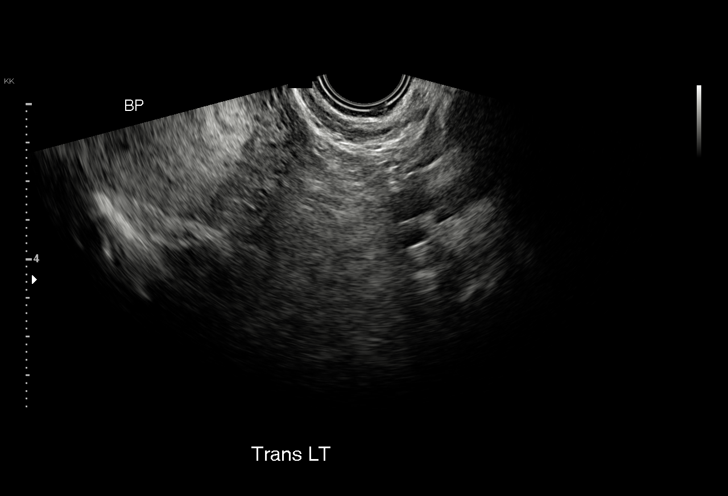
[im 56/61]
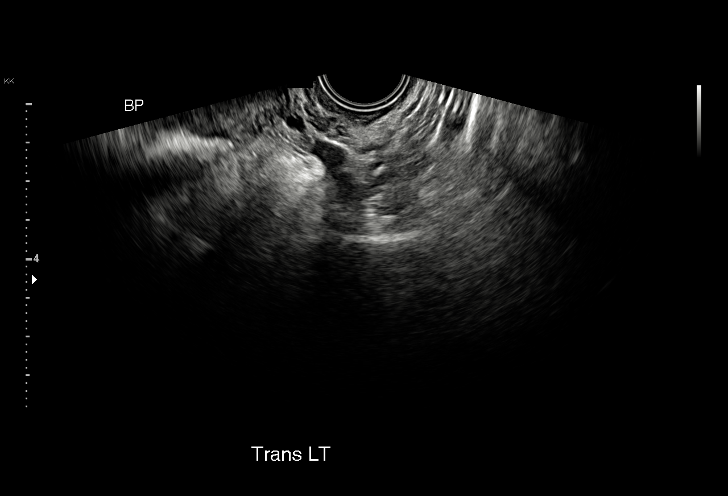
[im 61/61]
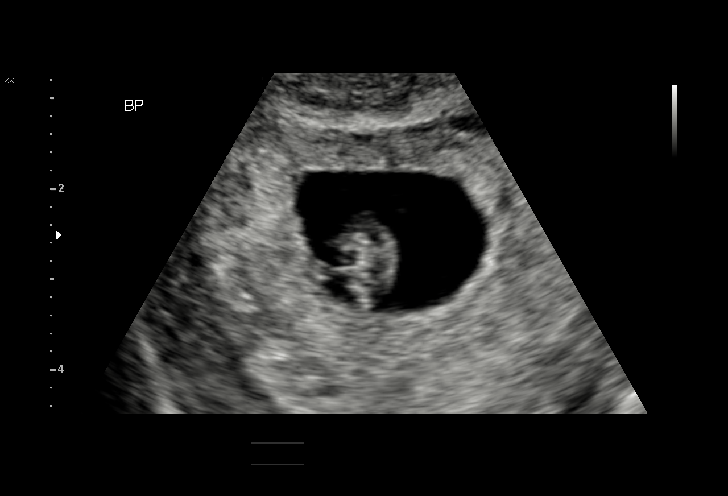

[15 of 28 positions shown; findings below may reference images not displayed]

FINDINGS: Intrauterine gestational sac: Single

Yolk sac:  Visualized.

Embryo:  Visualized.

Cardiac Activity: Visualized.

Heart Rate: 137 bpm

CRL:   10.5  mm   7 w 1 d                  US EDC: 05/04/2017

Subchorionic hemorrhage:  None visualized.

Maternal uterus/adnexae: Right ovary is within normal limits, noting
a 1.9 cm paraovarian cyst.

Left ovary is within normal limits, noting a corpus luteal cyst.

Trace pelvic fluid.
IMPRESSION: Single live intrauterine gestation, with estimated gestational age 7
weeks 1 day by crown-rump length.

## 2020-07-02 ENCOUNTER — Encounter: Payer: Self-pay | Admitting: Pediatrics

## 2020-07-02 ENCOUNTER — Ambulatory Visit: Payer: Self-pay

## 2020-07-02 ENCOUNTER — Other Ambulatory Visit: Payer: Self-pay

## 2020-07-02 ENCOUNTER — Ambulatory Visit (INDEPENDENT_AMBULATORY_CARE_PROVIDER_SITE_OTHER): Payer: Self-pay | Admitting: Pediatrics

## 2020-07-02 VITALS — BP 108/71 | HR 68 | Ht 59.65 in | Wt 104.8 lb

## 2020-07-02 DIAGNOSIS — Z3046 Encounter for surveillance of implantable subdermal contraceptive: Secondary | ICD-10-CM

## 2020-07-02 NOTE — Progress Notes (Signed)
History was provided by the patient.  Belinda Fisher is a 25 y.o. female who is here for nexplanon removed and replace. .  Patient, No Pcp Per   HPI:  Pt reports that she has had irregular periods. She has some brown discharge and then will have a period for sometimes up to two weeks.   Sexually active with one female partner. Has had two since last visit. Divorced from previous husband.   Desires STI testing, but is currently uninsured.   No LMP recorded.  Review of Systems  Constitutional: Negative for malaise/fatigue.  Eyes: Negative for double vision.  Respiratory: Negative for shortness of breath.   Cardiovascular: Negative for chest pain and palpitations.  Gastrointestinal: Negative for abdominal pain, constipation, diarrhea, nausea and vomiting.  Genitourinary: Negative for dysuria.  Musculoskeletal: Negative for joint pain and myalgias.  Skin: Negative for rash.  Neurological: Negative for dizziness and headaches.  Endo/Heme/Allergies: Does not bruise/bleed easily.    There are no problems to display for this patient.   Current Outpatient Medications on File Prior to Visit  Medication Sig Dispense Refill  . Prenatal Vit-Fe Fumarate-FA (PRENATAL MULTIVITAMIN) TABS tablet Take daily at 12 noon by mouth. (Patient not taking: Reported on 07/02/2020)     No current facility-administered medications on file prior to visit.    No Known Allergies  Social History: Confidentiality was discussed with the patient and if applicable, with caregiver as well. Tobacco: none Secondhand smoke exposure? no Drugs/EtOH: no Sexually active? yes - female partners   Safety: safe to self and at home  Last STI Screening: needs  Pregnancy Prevention: nexplanon  Physical Exam:    Vitals:   07/02/20 0846  BP: 108/71  Pulse: 68  Weight: 104 lb 12.8 oz (47.5 kg)  Height: 4' 11.65" (1.515 m)    Growth percentile SmartLinks can only be used for patients less than 59 years  old.  Physical Exam Vitals and nursing note reviewed.  Constitutional:      General: She is not in acute distress.    Appearance: She is well-developed.  Neck:     Thyroid: No thyromegaly.  Cardiovascular:     Rate and Rhythm: Normal rate and regular rhythm.     Heart sounds: No murmur heard.   Pulmonary:     Breath sounds: Normal breath sounds.  Abdominal:     Palpations: Abdomen is soft. There is no mass.     Tenderness: There is no abdominal tenderness. There is no guarding.  Musculoskeletal:     Right lower leg: No edema.     Left lower leg: No edema.  Lymphadenopathy:     Cervical: No cervical adenopathy.  Skin:    General: Skin is warm.     Findings: No rash.  Neurological:     Mental Status: She is alert.     Comments: No tremor     Assessment/Plan: 1. Encounter for removal and reinsertion of Nexplanon See procedure note.tolerated well. Recommended going to the health department for STI testing given her insurance status. Discussed procedure would be covered today under grant.  - etonogestrel (NEXPLANON) implant 68 mg  Alfonso Ramus, FNP

## 2020-07-02 NOTE — Patient Instructions (Addendum)
Go to health department for screening for sexually transmitted infections!   Congratulations on getting your Nexplanon placement!  Below is some important information about Nexplanon.  First remember that Nexplanon does not prevent sexually transmitted infections.  Condoms will help prevent sexually transmitted infections. The Nexplanon starts working 7 days after it was inserted.  There is a risk of getting pregnant if you have unprotected sex in those first 7 days after placement of the Nexplanon.  The Nexplanon lasts for 3 years but can be removed at any time.  You can become pregnant as early as 1 week after removal.  You can have a new Nexplanon put in after the old one is removed if you like.  It is not known whether Nexplanon is as effective in women who are very overweight because the studies did not include many overweight women.  Nexplanon interacts with some medications, including barbiturates, bosentan, carbamazepine, felbamate, griseofulvin, oxcarbazepine, phenytoin, rifampin, St. John's wort, topiramate, HIV medicines.  Please alert your doctor if you are on any of these medicines.  Always tell other healthcare providers that you have a Nexplanon in your arm.  The Nexplanon was placed just under the skin.  Leave the outside bandage on for 24 hours.  Leave the smaller bandage on for 3-5 days or until it falls off on its own.  Keep the area clean and dry for 3-5 days. There is usually bruising or swelling at the insertion site for a few days to a week after placement.  If you see redness or pus draining from the insertion site, call us immediately.  Keep your user card with the date the implant was placed and the date the implant is to be removed.  The most common side effect is a change in your menstrual bleeding pattern.   This bleeding is generally not harmful to you but can be annoying.  Call or come in to see [place] if you have any concerns about the bleeding or if you have any side  effects or questions.    We will call you in 1 week to check in and we would like you to return to the clinic for a follow-up visit in 1 month.  You can call [facility] 24 hours a day with any questions or concerns.  There is always a nurse or doctor available to take your call.  Call 9-1-1 if you have a life-threatening emergency.  For anything else, please call us at [phone] before heading to the ER.

## 2020-07-09 MED ORDER — ETONOGESTREL 68 MG ~~LOC~~ IMPL
68.0000 mg | DRUG_IMPLANT | Freq: Once | SUBCUTANEOUS | Status: AC
  Administered 2020-07-02: 68 mg via SUBCUTANEOUS

## 2022-02-27 ENCOUNTER — Ambulatory Visit (INDEPENDENT_AMBULATORY_CARE_PROVIDER_SITE_OTHER): Payer: Self-pay | Admitting: Radiology

## 2022-02-27 ENCOUNTER — Encounter: Payer: Self-pay | Admitting: Radiology

## 2022-02-27 VITALS — BP 98/62 | Ht 59.5 in | Wt 113.0 lb

## 2022-02-27 DIAGNOSIS — Z975 Presence of (intrauterine) contraceptive device: Secondary | ICD-10-CM

## 2022-02-27 DIAGNOSIS — N921 Excessive and frequent menstruation with irregular cycle: Secondary | ICD-10-CM

## 2022-02-27 MED ORDER — NORETHIN ACE-ETH ESTRAD-FE 1-20 MG-MCG PO TABS: 1.0000 | ORAL_TABLET | Freq: Every day | ORAL | 11 refills | Status: DC

## 2022-02-27 NOTE — Progress Notes (Signed)
   Belinda Fisher [facility] 05/30/1996 [id number]   History:  26 y.o. G2P1 presents fas a new patient, referred for BTB with Nexplanon.  Lasted 10 days during her last cycle.  Gynecologic History No LMP recorded. Patient has had an implant.   Contraception/Family planning: Nexplanon Sexually active: yes Last Pap: 5/23. Results were: normal   Obstetric History OB History  Gravida Para Term Preterm AB Living  2 1       1   SAB IAB Ectopic Multiple Live Births          1    # Outcome Date GA Lbr Len/2nd Weight Sex Delivery Anes PTL Lv  2 Para           1 Gravida              The following portions of the patient's history were reviewed and updated as appropriate: allergies, current medications, past family history, past medical history, past social history, past surgical history, and problem list.  Review of Systems Pertinent items noted in HPI and remainder of comprehensive ROS otherwise negative.   Past medical history, past surgical history, family history and social history were all reviewed and documented in the EPIC chart.   Exam:  Vitals:   02/27/22 1510  BP: 98/62  Weight: 113 lb (51.3 kg)  Height: 4' 11.5" (1.511 m)   Body mass index is 22.44 kg/m.  General appearance:  Normal Genitourinary   Inguinal/mons:  Normal without inguinal adenopathy  External genitalia:  Normal appearing vulva with no masses, tenderness, or lesions  BUS/Urethra/Skene's glands:  Normal without masses or exudate  Vagina:  Normal appearing with normal color and discharge, no lesions  Cervix:  Normal appearing without discharge or lesions  Uterus:  Normal in size, shape and contour.  Mobile, nontender  Adnexa/parametria:     Rt: Normal in size, without masses or tenderness.   Lt: Normal in size, without masses or tenderness.  Anus and perineum: Normal   Patient informed chaperone available to be present for breast and pelvic exam. Patient has requested no chaperone to be present.  Patient has been advised what will be completed during breast and pelvic exam.   Assessment/Plan:    1. Breakthrough bleeding on Nexplanon May try OCPs to reset - norethindrone-ethinyl estradiol-FE (JUNEL FE 1/20) 1-20 MG-MCG tablet; Take 1 tablet by mouth daily.  Dispense: 28 tablet   Belinda Fisher B WHNP-BC 3:22 PM 02/27/2022

## 2023-05-26 ENCOUNTER — Telehealth: Payer: Self-pay

## 2023-05-26 NOTE — Telephone Encounter (Signed)
Patient is 27yo and needs to be seen by an adult provider. We have called and left a message to return call. Appointment was scheduled in error due to age.

## 2023-06-24 NOTE — L&D Delivery Note (Signed)
 OB/GYN Faculty Practice Delivery Note  Dariel Pellecchia is a 28 y.o. G2P1001 s/p NSVD at [redacted]w[redacted]d. She was admitted for spontaneous labor.   ROM: 3h 84m with clear fluid GBS Status:  Negative/-- (12/12 1546) Maximum Maternal Temperature: 98.2 F    Labor Progress: Patient arrived at 7 cm dilation and was induced with AROM and progressed to complete.   Delivery Date/Time: 06/08/2024 at 1210 Delivery: Called to room and patient was complete and pushing. Head delivered in OA position. No nuchal cord present. Shoulder and body delivered in usual fashion. Infant with spontaneous cry, placed on mother's abdomen, dried and stimulated. Cord clamped x 2 after 1-minute delay, and cut by FOB. Cord blood drawn. Placenta delivered spontaneously with gentle cord traction. Fundus firm with massage and Pitocin . Labia, perineum, vagina, and cervix inspected with hemostatic 1st degree.   Placenta: 3v intact, to L&D Complications: none Lacerations: 1st degree hemostatic, not repaired EBL: 600 cc Analgesia: none   Infant: Baby boy Ethan  APGAR (1 MIN): 8  APGAR (5 MINS): 9    Weight: 3440 grams  Donnice CHRISTELLA Carolus, MD/MPH Attending Family Medicine Physician, [facility], [facility]

## 2023-07-09 ENCOUNTER — Ambulatory Visit: Payer: Self-pay | Admitting: Family

## 2024-01-15 ENCOUNTER — Inpatient Hospital Stay (HOSPITAL_COMMUNITY)
Admission: AD | Admit: 2024-01-15 | Discharge: 2024-01-15 | Disposition: A | Discharge status: Home / Self Care | Payer: Self-pay | Attending: Obstetrics & Gynecology | Admitting: Obstetrics & Gynecology

## 2024-01-15 DIAGNOSIS — O4692 Antepartum hemorrhage, unspecified, second trimester: Secondary | ICD-10-CM | POA: Insufficient documentation

## 2024-01-15 DIAGNOSIS — Z3A16 16 weeks gestation of pregnancy: Secondary | ICD-10-CM | POA: Insufficient documentation

## 2024-01-15 LAB — WET PREP, GENITAL
Clue Cells Wet Prep HPF POC: NONE SEEN
Sperm: NONE SEEN
Trich, Wet Prep: NONE SEEN
WBC, Wet Prep HPF POC: <10 (ref <10)
Yeast Wet Prep HPF POC: NONE SEEN

## 2024-01-15 LAB — URINALYSIS, ROUTINE W REFLEX MICROSCOPIC
Bilirubin Urine: NEGATIVE
Glucose, UA: NEGATIVE mg/dL
Hgb urine dipstick: NEGATIVE
Ketones, ur: NEGATIVE mg/dL
Leukocytes,Ua: NEGATIVE
Nitrite: NEGATIVE
Protein, ur: NEGATIVE mg/dL
Specific Gravity, Urine: 1.009 (ref 1.005–1.030)
pH: 7 (ref 5.0–8.0)

## 2024-01-15 NOTE — MAU Provider Note (Addendum)
 Chief Complaint: Vaginal Bleeding   Event Date/Time   First Provider Initiated Contact with Patient 01/15/24 1443      SUBJECTIVE HPI: Belinda Fisher is a 28 y.o. G2P1001 at [redacted]w[redacted]d by LMP who presents to maternity admissions reporting vaginal spotting. She was in the shower this morning and while cleaning herself she noticed she had some bleeding similar to starting a period. No bleeding since then. She denies any abdominal pain, but has had 2-3 episodes of sharp pain of lower abdomen that shoots towards her vagina, pain lasts less than a few seconds. Both instances happened while she was standing. Had a form from health department with confirmation of pregnancy.  She denies vaginal itching/burning, urinary symptoms, h/a, dizziness, n/v, or fever/chills.    HPI  Past Medical History:  Diagnosis Date   Medical history non-contributory    Past Surgical History:  Procedure Laterality Date   NO PAST SURGERIES     Social History   Socioeconomic History   Marital status: Legally Separated    Spouse name: Not on file   Number of children: Not on file   Years of education: Not on file   Highest education level: Not on file  Occupational History   Not on file  Tobacco Use   Smoking status: Never   Smokeless tobacco: Never  Vaping Use   Vaping status: Never Used  Substance and Sexual Activity   Alcohol use: No   Drug use: No   Sexual activity: Yes    Partners: Male    Birth control/protection: Implant    Comment: Nexplanon  inserted left arm 07/02/21, removed Feb 2025  Other Topics Concern   Not on file  Social History Narrative   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   Current Outpatient Medications on File Prior to Encounter  Medication Sig Dispense Refill   Prenatal Vit-Fe Fumarate-FA (PRENATAL VITAMIN PO) Take 1 tablet by mouth daily.     No Known Allergies  ROS:  Pertinent positives/negatives listed above.  I have reviewed patient's Past Medical Hx, Surgical Hx, Family Hx, Social Hx, medications and allergies.   Physical Exam  Patient Vitals for the past 24 hrs:  BP Temp Temp src Pulse Resp SpO2 Height Weight  01/15/24 1509 (!) 98/50 98.7 F (37.1 C) Oral 71 16 100 % 4' 11 (1.499 m) 117 lb 9.6 oz (53.3 kg)   Constitutional: Well-developed, well-nourished female in no acute distress.  Cardiovascular: normal rate Respiratory: normal effort GI: Abd soft, non-tender. Pos BS x 4 MS: Extremities nontender, no edema, normal ROM Neurologic: Alert and oriented x 4.  GU: Neg CVAT.  PELVIC EXAM: No discharge or bleeding at the vagina or vulva. Cervix was closed, firm, and long with no bleeding  Fetal doppler:   LAB RESULTS Results for orders placed or performed during the hospital encounter of 01/15/24 (from the past 24 hours)  Urinalysis, Routine w reflex microscopic -Urine, Clean Catch     Status: Abnormal   Collection Time: 01/15/24  2:57 PM  Result Value Ref Range   Color,  Urine STRAW (A) YELLOW   APPearance CLEAR CLEAR   Specific Gravity, Urine 1.009 1.005 - 1.030   pH 7.0 5.0 - 8.0   Glucose, UA NEGATIVE NEGATIVE mg/dL   Hgb urine dipstick NEGATIVE NEGATIVE   Bilirubin Urine NEGATIVE NEGATIVE   Ketones, ur NEGATIVE NEGATIVE mg/dL   Protein, ur NEGATIVE NEGATIVE mg/dL   Nitrite NEGATIVE NEGATIVE   Leukocytes,Ua NEGATIVE NEGATIVE  Wet prep, genital     Status: None   Collection Time: 01/15/24  2:57 PM   Specimen: Vaginal  Result Value Ref Range   Yeast Wet Prep HPF POC NONE SEEN NONE SEEN   Trich, Wet Prep NONE SEEN NONE SEEN    Clue Cells Wet Prep HPF POC NONE SEEN NONE SEEN   WBC, Wet Prep HPF POC <10 <10   Sperm NONE SEEN        IMAGING No results found.  MAU Management/MDM: Vaginal Bleeding: Likely normal vaginal bleeding of pregnancy DDX included cervical insufficiency, bleeding 2/2 infection, subchorionic hemorrhage. Wet prep was negative, UA was unremarkable making infection less likely. Mild bleeding later in 2nd trimester making subchorionic hemorrhage less likely, cervical exam closed, firm, and long with no bleeding so low concern for cervical insufficiency.  Orders Placed This Encounter  Procedures   Wet prep, genital   Urinalysis, Routine w reflex microscopic -Urine, Clean Catch   Diet general   Discharge patient Discharge disposition: 01-Home or Self Care; Discharge patient date: 01/15/2024   Discharge patient    No orders of the defined types were placed in this encounter.   Treatments in MAU included: -Wet prep -GC swab on cervical check -Pelvic exam -Prenatal records reviewed.  ASSESSMENT 1. [redacted] weeks gestation of pregnancy   2. Vaginal bleeding in pregnancy, second trimester     PLAN Discharge home with strict return precautions. Allergies as of 01/15/2024   No Known Allergies      Medication List     STOP taking these medications    Nexplanon  68 MG Impl implant Generic drug: etonogestrel    norethindrone-ethinyl estradiol-FE 1-20 MG-MCG tablet Commonly known as: Junel FE 1/20       TAKE these medications    PRENATAL VITAMIN PO Take 1 tablet by mouth daily.        Follow-up Information     [facility] Follow up.   Specialty: Obstetrics and Gynecology Why: As needed for emergencies Contact information: [address] [place]  [address] [phone]                Fairy Amy, MD [facility] Family Medicine Resident, PGY-1 01/15/2024  5:31 PM   Midwife Attestation:  I personally saw and evaluated  the patient, performing the key elements of the service. I developed and verified the management plan that is described in the resident's/student's note, and I agree with the content with my edits above. VSS, HRR&R, Resp unlabored, Legs neg.    [name], CNM 8:32 PM

## 2024-01-15 NOTE — MAU Note (Addendum)
 Belinda Fisher is a 28 y.o. at [redacted]w[redacted]d here in MAU reporting: had some bleeding while she was rinsing herself, was cleaning herself with her fingers when started, saw 2 small little clots. None since. No pain. 1st appt at [facility] next Tues.   Denies n/v/d/c, no urinary symptoms. Onset of complaint: 1100 Pain score: none Vitals:   01/15/24 1509  BP: (!) 98/50  Pulse: 71  Resp: 16  Temp: 98.7 F (37.1 C)  SpO2: 100%     FHT:140 Lab orders placed from triage:    Has not had US    Has been seen at the [facility]

## 2024-01-18 LAB — GC/CHLAMYDIA PROBE AMP (~~LOC~~) NOT AT ARMC
Chlamydia: NEGATIVE
Comment: NEGATIVE
Comment: NORMAL
Neisseria Gonorrhea: NEGATIVE

## 2024-01-18 NOTE — Progress Notes (Unsigned)
 Patient Name: Belinda Fisher Date of Birth: 12-25-1995 [facility] Initial Prenatal Visit  Belinda Fisher is a 28 y.o. year old G2P1001 at [redacted]w[redacted]d who presents for her initial prenatal visit. Pregnancy is not planned She reports no current symptoms. She is taking a prenatal vitamin.  She denies pelvic pain or vaginal bleeding.   Pregnancy Dating: The patient is dated by LMP.  LMP: 09/22/2023 Period is certain:  Yes.  Periods were regular:  No.  LMP was a typical period:  Yes.  Using hormonal contraception in 3 months prior to conception: Yes, Nexplanon  removed in February 2025  Lab Review: Blood type: O POS Rh Status: + Hemoglobin electrophoresis reviewed: Yes, normal Rubella: Immune Varicella status is Immune Reviewed from prior pregnancy in 2018, new OB labs will be ordered today  PMH: Reviewed and as detailed below: HTN: No  Gestational Hypertension/preeclampsia: No  Type 1 or 2 Diabetes: No  Depression:  No  Seizure disorder:  No VTE: No ,  History of STI No,  Abnormal Pap smear:  No, Genital herpes simplex:  No   PSH: Gynecologic Surgery:  no Surgical history reviewed, notable for: none  Obstetric History: Obstetric history tab updated and reviewed.  Summary of prior pregnancies: one vaginal delivery at term Cesarean delivery: No  Gestational Diabetes:  No Hypertension in pregnancy: No History of preterm birth: No History of LGA/SGA infant:  No History of shoulder dystocia: No Indications for referral were reviewed, and the patient has no obstetric indications for referral to High Risk OB Clinic at this time.   Social History: Partner's name: Penne   Tobacco use: No Alcohol use:  No Other substance use:  No  Financial Assistance:  Adopt A Mom Patient?: No  If AAM, have they contacted Eric to enroll in [employer] Assistance?: N/A  Current Medications:  No prescription meds or OTC. Only prenatal vitamin  Reviewed and  appropriate in pregnancy.   Genetic and Infection Screen: Flow Sheet Updated Yes  Prenatal Exam: Gen: Well nourished, well developed.  No distress.  Vitals noted. HEENT: Normocephalic, atraumatic.   CV: RRR no murmur, gallops or rubs Lungs: CTAB.  Normal respiratory effort without wheezes or rales. Abd: soft, NTND. Uterus palpable below the umbilicus GU: Normal external female genitalia without lesions.  vagina well rugated without lesions. Moderate white creamy vaginal discharge.  Cervical os visually closed. Increased vascularization on the anterior aspect of the cervix Psych: Normal grooming and dress.  Not depressed or anxious appearing.  Normal thought content and process without flight of ideas or looseness of associations  Fetal heart tones: 145, appropriate*  Pelvic exam chaperoned by Harlene Reiter CMA.  Assessment/Plan:  Belinda Fisher is a 28 y.o. G2P1001 at [redacted]w[redacted]d who presents to initiate prenatal care. She is doing well.  No current pregnancy issues.  Routine prenatal care: Dating is not reliable, but patient is 17w so anatomy scan scheduled and will confirm dates. Dating tab updated. Fetal heart tones Appropriate Pre-pregnancy weight 105 lbs. Expected weight gain this pregnancy is 25-35 pounds . Healthy weight gain discussed Prenatal labs reviewed from first pregnancy, ordered additional labs today as appropriate. Pap performed as last pap in records was noted to be ASCUS in 2022. Patient does report second follow up pap that was normal after that, but unable to find this in her records Indications for referral to HROB were reviewed and the patient does not meet criteria for referral.  Medication list reviewed and updated.  Bleeding, LOF, ctx, and pain precautions reviewed. Importance of prenatal vitamins reviewed. Refill prescribed. The patient has the following indications for aspirinto begin 81 mg at 12-16 weeks: One high risk condition: no single high risk  condition  MORE than one moderate risk condition: low SES Aspirin was not  recommended today based upon above risk factors (one high risk condition or more than one moderate risk factor)  The patient will not be age 73 or over at time of delivery. Referral to genetic counseling was not offered today.  The patient has the following risk factors for preexisting diabetes: Reviewed indications for early 1 hour glucose testing, not indicated . An early 1 hour glucose tolerance test was not ordered. Pregnancy Medical Home and PHQ-9 forms completed, problems noted: No Influenza vaccine not administered as not influenza season.   COVID vaccination was not discussed today. Discuss at next visit with Flu vaccine. Anatomy ultrasound ordered and scheduled for Sept 9. Patient is not interested in genetic screening. Pregnancy education including expected weight gain in pregnancy, OTC medication use, continued use of prenatal vitamin, smoking cessation if applicable, and nutrition in pregnancy.    2. Pregnancy issues include the following which were addressed today:  none   Follow up 3 weeks for next prenatal visit with OB clinic then with me around [redacted] weeks GA.

## 2024-01-19 ENCOUNTER — Telehealth: Payer: Self-pay

## 2024-01-19 ENCOUNTER — Other Ambulatory Visit (HOSPITAL_COMMUNITY)
Admission: RE | Admit: 2024-01-19 | Discharge: 2024-01-19 | Disposition: A | Discharge status: Home / Self Care | Payer: Self-pay | Source: Ambulatory Visit | Attending: Family Medicine | Admitting: Family Medicine

## 2024-01-19 ENCOUNTER — Ambulatory Visit (INDEPENDENT_AMBULATORY_CARE_PROVIDER_SITE_OTHER): Payer: Self-pay

## 2024-01-19 VITALS — BP 90/62 | HR 63 | Wt 116.6 lb

## 2024-01-19 DIAGNOSIS — Z3A17 17 weeks gestation of pregnancy: Secondary | ICD-10-CM

## 2024-01-19 DIAGNOSIS — Z124 Encounter for screening for malignant neoplasm of cervix: Secondary | ICD-10-CM | POA: Insufficient documentation

## 2024-01-19 MED ORDER — PRENATAL VITAMIN 27-0.8 MG PO TABS: 1.0000 | ORAL_TABLET | Freq: Every day | ORAL | 0 refills | Status: AC

## 2024-01-19 NOTE — Patient Instructions (Addendum)
 Anatomy Ultrasound scheduled for September 9th at 7 am.  Arrive 15 minutes early to Maternal Fetal Care at [facility] [address] [place], [place]  Please return for your next OB visit in 3 weeks  Pregnancy Related Return Precautions The follow are signs/symptoms that are abnormal in pregnancy and may require further evaluation at the MAU in the [facility] at [facility]: You have cramping/contractions that do not go away with drinking water, especially if they are lasting 30 seconds to 1.5 minutes, coming and going every 5-10 minutes for an hour or more, or are getting stronger and you cannot walk or talk while having a contraction/cramp. Your water breaks.  Sometimes it is a big gush of fluid, sometimes it is just a trickle that keeps getting your underwear wet or running down your legs You have vaginal bleeding.    You are beyond 22 weeks and do not feel your baby moving like normal.  If you do not, get something to eat and drink (something cold or something with sugar like peanut butter or juice) and lay down and focus on feeling your baby move. If your baby is still not moving like normal, you should go to MAU. You should feel your baby move 6 times in one hour, or 10 times in two hours. You have a persistent headache that does not go away with 1000mg  of Tylenol , is accompanied by vision changes, chest pain, difficulty breathing, severe pain in your right upper abdomen, or worsening leg swelling- these can all be signs of high blood pressure in pregnancy and need to be evaluated by a provider immediately  For any pregnancy-related emergencies, please go to the Maternity Admissions Unit in the Women's & Children's Center at [facility]. You will use hospital Entrance C.    Safe Medications in Pregnancy   Acne:  Benzoyl Peroxide  Salicylic Acid   Backache/Headache:  Tylenol : 2 regular strength every 4 hours OR               2 extra strength every 6  hours  ** Do not exceed 4000 mg of tylenol  in 24 hours   Colds/Coughs/Allergies:  Benadryl  (alcohol free) 25 mg every 6 hours as needed  Breath right strips  Claritin  Cepacol throat lozenges  Chloraseptic throat spray  Cold-Eeze- up to three times per day  Cough drops, alcohol free  Flonase (by prescription only)  Guaifenesin  Mucinex  Robitussin DM (plain only, alcohol free)  Saline nasal spray/drops  Sudafed (pseudoephedrine) & Actifed * use only after [redacted] weeks gestation and if you do not have high blood pressure  Tylenol   Vicks Vaporub  Zinc lozenges  Zyrtec   Constipation:  Colace  Ducolax suppositories  Fleet enema  Glycerin  suppositories  Metamucil  Milk of magnesia  Miralax  Senokot  Smooth move tea   Diarrhea:  Kaopectate  Imodium A-D  **NO pepto Bismol   Hemorrhoids:  Anusol  Anusol HC  Preparation H  Tucks   Indigestion:  Tums  Maalox  Mylanta  Zantac  Pepcid   Insomnia:  Benadryl  (alcohol free) 25mg  every 6 hours as needed  Tylenol  PM  Unisom, no Gelcaps   Leg Cramps:  Tums  MagGel   Nausea/Vomiting:  Bonine  Dramamine  Emetrol  Ginger extract  Sea bands  Meclizine  Nausea medication to take during pregnancy:  Unisom (doxylamine succinate 25 mg tablets) Take one tablet daily at bedtime. If symptoms are not adequately controlled,  the dose can be increased to a maximum recommended dose of two tablets daily (1/2 tablet in the morning, 1/2 tablet mid-afternoon and one at bedtime).  Vitamin B6 100mg  tablets. Take one tablet twice a day (up to 200 mg per day).   Skin Rashes:  Aveeno products  Benadryl  cream or 25mg  every 6 hours as needed  Calamine Lotion  1% cortisone cream   Yeast infection:  Gyne-lotrimin 7  Monistat 7   **If taking multiple medications, please check labels to avoid duplicating the same active ingredients  **Take medications as directed on the label  **Do not take medications that contain ibuprofen  or aspirin  without discussing with your physician  Preparing for Baby: Buy a rear-facing car seat. Learn how to install it in your car. Set up a safe place for baby to sleep - this should be a bassinet or crib where baby will sleep alone on their back without pillows, blankets, or stuffed animals. If you are planning to breastfeed, I encourage you to attend a breastfeeding class.

## 2024-01-19 NOTE — Telephone Encounter (Signed)
 Called MFM and spoke with Avante to scheduled patient an US  appointment while patient was in office  Scheduled appointment for 03/01/24 at 7am.  Written appointment information and location for provider to provide for patient.   Harlene Reiter, CMA

## 2024-01-21 LAB — CBC/D/PLT+RPR+RH+ABO+RUBIGG...
Antibody Screen: NEGATIVE
Basophils Absolute: 0.1 x10E3/uL (ref 0.0–0.2)
Basos: 1 %
Bilirubin, UA: NEGATIVE
EOS (ABSOLUTE): 0.1 x10E3/uL (ref 0.0–0.4)
Eos: 1 %
Glucose, UA: NEGATIVE
HCV Ab: NONREACTIVE
HIV Screen 4th Generation wRfx: NONREACTIVE
Hematocrit: 36.4 % (ref 34.0–46.6)
Hemoglobin: 11.5 g/dL (ref 11.1–15.9)
Hepatitis B Surface Ag: NEGATIVE
Immature Grans (Abs): 0 x10E3/uL (ref 0.0–0.1)
Immature Granulocytes: 0 %
Ketones, UA: NEGATIVE
Leukocytes,UA: NEGATIVE
Lymphocytes Absolute: 1.7 x10E3/uL (ref 0.7–3.1)
Lymphs: 18 %
MCH: 29 pg (ref 26.6–33.0)
MCHC: 31.6 g/dL (ref 31.5–35.7)
MCV: 92 fL (ref 79–97)
Monocytes Absolute: 0.6 x10E3/uL (ref 0.1–0.9)
Monocytes: 6 %
Neutrophils Absolute: 7.4 x10E3/uL — ABNORMAL HIGH (ref 1.4–7.0)
Neutrophils: 74 %
Nitrite, UA: NEGATIVE
Platelets: 225 x10E3/uL (ref 150–450)
RBC, UA: NEGATIVE
RBC: 3.97 x10E6/uL (ref 3.77–5.28)
RDW: 13.6 % (ref 11.7–15.4)
RPR Ser Ql: NONREACTIVE
Rh Factor: POSITIVE
Rubella Antibodies, IGG: 4.88 {index} (ref >0.99)
Specific Gravity, UA: 1.022 (ref 1.005–1.030)
Urobilinogen, Ur: 0.2 mg/dL (ref 0.2–1.0)
WBC: 9.9 x10E3/uL (ref 3.4–10.8)
pH, UA: 7.5 (ref 5.0–7.5)

## 2024-01-21 LAB — MICROSCOPIC EXAMINATION
Bacteria, UA: NONE SEEN
Casts: NONE SEEN /LPF

## 2024-01-21 LAB — URINE CULTURE, OB REFLEX: Organism ID, Bacteria: NO GROWTH

## 2024-01-21 LAB — HCV INTERPRETATION

## 2024-01-24 ENCOUNTER — Ambulatory Visit: Payer: Self-pay

## 2024-01-24 LAB — CYTOLOGY - PAP: Diagnosis: NEGATIVE

## 2024-01-24 NOTE — Progress Notes (Signed)
 Labs normal. Patient letter sent.

## 2024-01-29 ENCOUNTER — Encounter: Payer: Self-pay | Admitting: Family Medicine

## 2024-02-11 ENCOUNTER — Ambulatory Visit: Payer: Self-pay | Admitting: Family Medicine

## 2024-02-11 VITALS — BP 98/57 | HR 77 | Ht 59.0 in | Wt 119.6 lb

## 2024-02-11 DIAGNOSIS — Z3490 Encounter for supervision of normal pregnancy, unspecified, unspecified trimester: Secondary | ICD-10-CM | POA: Insufficient documentation

## 2024-02-11 DIAGNOSIS — Z3A20 20 weeks gestation of pregnancy: Secondary | ICD-10-CM

## 2024-02-11 NOTE — Patient Instructions (Signed)
 Please return for your next OB visit in 4 weeks  Pregnancy Related Return Precautions The follow are signs/symptoms that are abnormal in pregnancy and may require further evaluation at the MAU in the [facility] at [facility]: You have cramping/contractions that do not go away with drinking water, especially if they are lasting 30 seconds to 1.5 minutes, coming and going every 5-10 minutes for an hour or more, or are getting stronger and you cannot walk or talk while having a contraction/cramp. Your water breaks.  Sometimes it is a big gush of fluid, sometimes it is just a trickle that keeps getting your underwear wet or running down your legs You have vaginal bleeding.    You are beyond 22 weeks and do not feel your baby moving like normal.  If you do not, get something to eat and drink (something cold or something with sugar like peanut butter or juice) and lay down and focus on feeling your baby move. If your baby is still not moving like normal, you should go to MAU. You should feel your baby move 6 times in one hour, or 10 times in two hours. You have a persistent headache that does not go away with 1000mg  of Tylenol , is accompanied by vision changes, chest pain, difficulty breathing, severe pain in your right upper abdomen, or worsening leg swelling- these can all be signs of high blood pressure in pregnancy and need to be evaluated by a provider immediately  For any pregnancy-related emergencies, please go to the Maternity Admissions Unit in the Women's & Children's Center at [facility]. You will use hospital Entrance C.    Safe Medications in Pregnancy   Acne:  Benzoyl Peroxide  Salicylic Acid   Backache/Headache:  Tylenol : 2 regular strength every 4 hours OR               2 extra strength every 6 hours  ** Do not exceed 4000 mg of tylenol  in 24 hours   Colds/Coughs/Allergies:  Benadryl  (alcohol free) 25 mg every 6 hours as needed  Breath right strips   Claritin  Cepacol throat lozenges  Chloraseptic throat spray  Cold-Eeze- up to three times per day  Cough drops, alcohol free  Flonase (by prescription only)  Guaifenesin  Mucinex  Robitussin DM (plain only, alcohol free)  Saline nasal spray/drops  Sudafed (pseudoephedrine) & Actifed * use only after [redacted] weeks gestation and if you do not have high blood pressure  Tylenol   Vicks Vaporub  Zinc lozenges  Zyrtec   Constipation:  Colace  Ducolax suppositories  Fleet enema  Glycerin  suppositories  Metamucil  Milk of magnesia  Miralax  Senokot  Smooth move tea   Diarrhea:  Kaopectate  Imodium A-D  **NO pepto Bismol   Hemorrhoids:  Anusol  Anusol HC  Preparation H  Tucks   Indigestion:  Tums  Maalox  Mylanta  Zantac  Pepcid   Insomnia:  Benadryl  (alcohol free) 25mg  every 6 hours as needed  Tylenol  PM  Unisom, no Gelcaps   Leg Cramps:  Tums  MagGel   Nausea/Vomiting:  Bonine  Dramamine  Emetrol  Ginger extract  Sea bands  Meclizine  Nausea medication to take during pregnancy:  Unisom (doxylamine succinate 25 mg tablets) Take one tablet daily at bedtime. If symptoms are not adequately controlled, the dose can be increased to a maximum recommended dose of two tablets daily (1/2 tablet in the morning, 1/2 tablet mid-afternoon and one at bedtime).  Vitamin  B6 100mg  tablets. Take one tablet twice a day (up to 200 mg per day).   Skin Rashes:  Aveeno products  Benadryl  cream or 25mg  every 6 hours as needed  Calamine Lotion  1% cortisone cream   Yeast infection:  Gyne-lotrimin 7  Monistat 7   **If taking multiple medications, please check labels to avoid duplicating the same active ingredients  **Take medications as directed on the label  **Do not take medications that contain ibuprofen  or aspirin without discussing with your physician

## 2024-02-11 NOTE — Progress Notes (Cosign Needed Addendum)
  Patient Name: Belinda Fisher Date of Birth: 07-25-1995 [facility] Prenatal Visit  Prapti Grussing Ruddle is a 28 y.o. G2P1001 at [redacted]w[redacted]d here for routine follow up. She is dated by LMP.  She reports no complaints.  She denies vaginal bleeding.  See flow sheet for details.  Vitals:   02/11/24 0843  BP: (!) 98/57  Pulse: 77  SpO2: 100%     A/P: Pregnancy at [redacted]w[redacted]d.  Doing well.    Routine Prenatal Care:  Dating reviewed, Pending US  - dating unreliable by LMP Fetal heart tones Appropriate Influenza vaccine not administered as not influenza season.   The patient has the following indication for screening preexisting diabetes: Reviewed indications for early 1 hour glucose testing, not indicated . Anatomy ultrasound ordered and scheduled on 03/01/24  Pregnancy education including expected weight gain in pregnancy, OTC medication use, continued use of prenatal vitamin, smoking cessation if applicable, and nutrition in pregnancy.   Bleeding and pain precautions reviewed. The patient has the following indications for aspirinto begin 81 mg at 12-16 weeks: One high risk condition: no single high risk condition  MORE than one moderate risk condition: nulliparity Aspirin was not  recommended today based upon above risk factors (one high risk condition or more than one moderate risk factor)   2. Pregnancy issues include the following and were addressed as appropriate today: None  Problem list  and pregnancy box updated: Yes.   Follow up 4 weeks.

## 2024-03-01 ENCOUNTER — Other Ambulatory Visit: Payer: Self-pay | Admitting: Family Medicine

## 2024-03-01 ENCOUNTER — Other Ambulatory Visit: Payer: Self-pay | Admitting: *Deleted

## 2024-03-01 ENCOUNTER — Ambulatory Visit: Payer: Self-pay | Attending: Obstetrics and Gynecology | Admitting: Obstetrics

## 2024-03-01 ENCOUNTER — Ambulatory Visit (HOSPITAL_BASED_OUTPATIENT_CLINIC_OR_DEPARTMENT_OTHER): Payer: Self-pay

## 2024-03-01 ENCOUNTER — Ambulatory Visit: Payer: Self-pay

## 2024-03-01 VITALS — BP 98/57

## 2024-03-01 DIAGNOSIS — Z3A17 17 weeks gestation of pregnancy: Secondary | ICD-10-CM

## 2024-03-01 DIAGNOSIS — O358XX0 Maternal care for other (suspected) fetal abnormality and damage, not applicable or unspecified: Secondary | ICD-10-CM | POA: Insufficient documentation

## 2024-03-01 DIAGNOSIS — Z124 Encounter for screening for malignant neoplasm of cervix: Secondary | ICD-10-CM

## 2024-03-01 DIAGNOSIS — Z3687 Encounter for antenatal screening for uncertain dates: Secondary | ICD-10-CM | POA: Insufficient documentation

## 2024-03-01 DIAGNOSIS — Z3A23 23 weeks gestation of pregnancy: Secondary | ICD-10-CM

## 2024-03-01 DIAGNOSIS — Z363 Encounter for antenatal screening for malformations: Secondary | ICD-10-CM | POA: Insufficient documentation

## 2024-03-01 NOTE — Progress Notes (Signed)
 MFM Consult Note  Belinda Fisher is currently at 23 weeks and 0 days.  She was seen for a detailed fetal anatomy scan.  She denies any significant past medical history and denies any problems in her current pregnancy.    She has declined all screening tests for fetal aneuploidy and her current pregnancy.  Sonographic findings Single intrauterine pregnancy at 23w 0d  Fetal cardiac activity:  Observed and appears normal. Presentation: Breech. The anatomic structures that were well seen appear normal without evidence of soft markers. Due to poor acoustic windows some structures remain suboptimally visualized. Fetal biometry shows the estimated fetal weight at the 95 percentile.  Amniotic fluid: Within normal limits.  MVP: 4.99 cm. Placenta: Posterior. Adnexa: No abnormality visualized. Cervical length: 3.9 cm.  The views of the fetal anatomy were limited today due to the fetal position.  The patient was informed that anomalies may be missed due to technical limitations. If the fetus is in a suboptimal position or maternal habitus is increased, visualization of the fetus in the maternal uterus may be impaired.  A follow-up exam was scheduled in 4 weeks to complete the views of the fetal anatomy.    The patient stated that all of her questions were answered today.  A total of 30 minutes was spent counseling and coordinating the care for this patient.  Greater than 50% of the time was spent in direct face-to-face contact.

## 2024-03-11 ENCOUNTER — Ambulatory Visit: Payer: Self-pay

## 2024-03-11 VITALS — BP 92/60 | HR 72 | Wt 125.1 lb

## 2024-03-11 DIAGNOSIS — Z3A24 24 weeks gestation of pregnancy: Secondary | ICD-10-CM

## 2024-03-11 NOTE — Progress Notes (Signed)
  [facility] Prenatal Visit  Belinda Fisher is a 28 y.o. G2P1001 at [redacted]w[redacted]d here for routine follow up. She is dated by LMP.  She reports no complaints.  She reports good fetal movement. No bleeding, loss of fluid, contractions. See flow sheet for details.  Vitals:   03/11/24 1545  BP: 92/60  Pulse: 72   No abdominal tenderness or skin changes Fundal height 24.5 cm, FHR 150  A/P: Pregnancy at [redacted]w[redacted]d.  Doing well.   Dating reviewed, dating tab is correct Fetal heart tones Appropriate Fundal height within expected range.  Anatomy ultrasound reviewed and notable for breech presentation and suboptimal anatomy evaluation. Patient rescheduled for follow up scan on 03/29/24.  Influenza vaccine not administered as patient declined, will continue to discuss. .  Indications for screening for preexisting diabetes include: Reviewed indications for early 1 hour glucose testing, not indicated .  Pregnancy education provided on the following topics: fetal growth and movement, maternal weight gain, ultrasound assessment, and upcoming laboratory assessment.  Additionally, we discussed that she would like to breast feed. Breast pump ordered today.   Circumcision: not desired Has a community navigator, but has not been contacted regarding car seat, crib/pack n play yet The patient has the following indications for aspirinto begin 81 mg at 12-16 weeks: One high risk condition: no single high risk condition  MORE than one moderate risk condition: none Aspirin was not  recommended today based upon above risk factors (one high risk condition or more than one moderate risk factor)  Sent to front desk to schedule for [facility] between 26-[redacted] weeks GA. Preterm labor precautions given.   2. Pregnancy issues include the following and were addressed as appropriate today:   None   Problem list and pregnancy box updated: Yes.     Follow up 4 weeks.

## 2024-03-11 NOTE — Patient Instructions (Signed)
 Please return for your next OB visit in a couple weeks. Schedule a follow up at the front desk between  03/22/24 and 04/04/24 for the [facility] FACULTY clinic.  Pregnancy Related Return Precautions The follow are signs/symptoms that are abnormal in pregnancy and may require further evaluation at the MAU in the [facility] & Children's Center at [facility]: You have cramping/contractions that do not go away with drinking water, especially if they are lasting 30 seconds to 1.5 minutes, coming and going every 5-10 minutes for an hour or more, or are getting stronger and you cannot walk or talk while having a contraction/cramp. Your water breaks.  Sometimes it is a big gush of fluid, sometimes it is just a trickle that keeps getting your underwear wet or running down your legs You have vaginal bleeding.    You are beyond 22 weeks and do not feel your baby moving like normal.  If you do not, get something to eat and drink (something cold or something with sugar like peanut butter or juice) and lay down and focus on feeling your baby move. If your baby is still not moving like normal, you should go to MAU. You should feel your baby move 6 times in one hour, or 10 times in two hours. You have a persistent headache that does not go away with 1000mg  of Tylenol , is accompanied by vision changes, chest pain, difficulty breathing, severe pain in your right upper abdomen, or worsening leg swelling- these can all be signs of high blood pressure in pregnancy and need to be evaluated by a provider immediately  For any pregnancy-related emergencies, please go to the Maternity Admissions Unit in the Women's & Children's Center at [facility]. You will use hospital Entrance C.    Preparing for Baby: Buy a rear-facing car seat. Learn how to install it in your car. Set up a safe place for baby to sleep - this should be a bassinet or crib where baby will sleep alone on their back without pillows, blankets, or stuffed  animals. If you are planning to breastfeed, I encourage you to attend a breastfeeding class.

## 2024-03-22 ENCOUNTER — Telehealth: Payer: Self-pay

## 2024-03-23 NOTE — Telephone Encounter (Signed)
 UPDATE 9/30  Notes: Reached out to pt via telephone call re: initial ob visit on 7/29 to offer consent to [employer]. Unable to reach- left voicemail giving short explanation of [employer] and requesting call back.   UPDATE 9/17  Notes: Reached out to pt via telephone call re: initial ob visit on 7/29 to offer consent to [employer]. Unable to reach- sent text requesting call back.   UPDATE 9/15  Notes: Reached out to pt via telephone call re: initial ob visit on 7/29 to offer consent to [employer]. Unable to reach- sent text requesting call back.   UPDATE 9/8  Notes: Reached out to pt via telephone call re: initial ob visit on 7/29 to offer consent to [employer]. Unable to reach- left voicemail giving short explanation of [employer] and requesting call back.    Arlan Birks Waddell Community Navigator [facility] [employer] [facility] [place] Direct Dial: [phone]

## 2024-03-29 ENCOUNTER — Other Ambulatory Visit: Payer: Self-pay | Admitting: *Deleted

## 2024-03-29 ENCOUNTER — Ambulatory Visit: Payer: Self-pay | Attending: Obstetrics | Admitting: Obstetrics

## 2024-03-29 ENCOUNTER — Ambulatory Visit: Payer: Self-pay

## 2024-03-29 VITALS — BP 94/62

## 2024-03-29 DIAGNOSIS — O3660X0 Maternal care for excessive fetal growth, unspecified trimester, not applicable or unspecified: Secondary | ICD-10-CM

## 2024-03-29 DIAGNOSIS — Z3A27 27 weeks gestation of pregnancy: Secondary | ICD-10-CM

## 2024-03-29 DIAGNOSIS — Z3482 Encounter for supervision of other normal pregnancy, second trimester: Secondary | ICD-10-CM

## 2024-03-29 DIAGNOSIS — O3662X0 Maternal care for excessive fetal growth, second trimester, not applicable or unspecified: Secondary | ICD-10-CM | POA: Insufficient documentation

## 2024-03-29 DIAGNOSIS — Z3687 Encounter for antenatal screening for uncertain dates: Secondary | ICD-10-CM | POA: Insufficient documentation

## 2024-03-29 DIAGNOSIS — O26842 Uterine size-date discrepancy, second trimester: Secondary | ICD-10-CM

## 2024-03-29 DIAGNOSIS — O358XX0 Maternal care for other (suspected) fetal abnormality and damage, not applicable or unspecified: Secondary | ICD-10-CM

## 2024-03-29 DIAGNOSIS — Z362 Encounter for other antenatal screening follow-up: Secondary | ICD-10-CM | POA: Insufficient documentation

## 2024-03-29 NOTE — Progress Notes (Signed)
 MFM Note  Belinda Fisher is currently at 27 weeks and 0 days.  Belinda Fisher has been followed as a large for gestational age fetus was noted on her prior exam.  Belinda Fisher denies any any problems since her last exam.  Sonographic findings Single intrauterine pregnancy at 27w 0d.  Fetal cardiac activity:  Observed and appears normal. Presentation: Cephalic. Interval fetal anatomy appears normal. Fetal biometry shows the estimated fetal weight of 2 pounds 11 ounces which measures at the 90th percentile. Amniotic fluid volume: Within normal limits. MVP: 4.36 cm. Placenta: Posterior.  There are limitations of prenatal ultrasound such as the inability to detect certain abnormalities due to poor visualization. Various factors such as fetal position, gestational age and maternal body habitus may increase the difficulty in visualizing the fetal anatomy.    Due to the large for gestational age fetus, a follow-up growth scan was scheduled in 9 weeks to assess the fetal growth.    Belinda Fisher should be screened for gestational diabetes at her next prenatal visit.

## 2024-03-30 DIAGNOSIS — O3660X0 Maternal care for excessive fetal growth, unspecified trimester, not applicable or unspecified: Secondary | ICD-10-CM | POA: Insufficient documentation

## 2024-03-31 ENCOUNTER — Encounter (HOSPITAL_COMMUNITY): Payer: Self-pay | Admitting: Obstetrics and Gynecology

## 2024-03-31 ENCOUNTER — Inpatient Hospital Stay (HOSPITAL_COMMUNITY)
Admission: AD | Admit: 2024-03-31 | Discharge: 2024-03-31 | Disposition: A | Discharge status: Home / Self Care | Payer: Self-pay | Attending: Family Medicine | Admitting: Family Medicine

## 2024-03-31 ENCOUNTER — Ambulatory Visit: Payer: Self-pay

## 2024-03-31 VITALS — BP 92/60 | HR 78 | Temp 97.5°F | Ht 59.0 in | Wt 126.2 lb

## 2024-03-31 DIAGNOSIS — Z3A27 27 weeks gestation of pregnancy: Secondary | ICD-10-CM

## 2024-03-31 DIAGNOSIS — Z23 Encounter for immunization: Secondary | ICD-10-CM

## 2024-03-31 DIAGNOSIS — O3660X0 Maternal care for excessive fetal growth, unspecified trimester, not applicable or unspecified: Secondary | ICD-10-CM

## 2024-03-31 DIAGNOSIS — O209 Hemorrhage in early pregnancy, unspecified: Secondary | ICD-10-CM

## 2024-03-31 DIAGNOSIS — O26852 Spotting complicating pregnancy, second trimester: Secondary | ICD-10-CM | POA: Insufficient documentation

## 2024-03-31 LAB — WET PREP, GENITAL
Clue Cells Wet Prep HPF POC: NONE SEEN
Sperm: NONE SEEN
Trich, Wet Prep: NONE SEEN
WBC, Wet Prep HPF POC: >=10 — AB (ref <10)
Yeast Wet Prep HPF POC: NONE SEEN

## 2024-03-31 LAB — POCT 1 HR PRENATAL GLUCOSE: Glucose 1 Hr Prenatal, POC: 101 mg/dL

## 2024-03-31 NOTE — Progress Notes (Signed)
  [facility] Prenatal Visit  Belinda Fisher is a 28 y.o. G2P1001 at [redacted]w[redacted]d here for routine follow up. She is dated by LMP.  She reports bleeding, no contractions, no cramping, and no leaking. Patient states scent bleeding x 3. She reports fetal movement and kick. She denies vaginal bleeding, contractions, or loss of fluid. See flow sheet for details.  Vitals:   03/31/24 0915  BP: 92/60  Pulse: 78  Temp: (!) 97.5 F (36.4 C)  SpO2: 100%      A/P: Pregnancy at [redacted]w[redacted]d.  Doing well.  Stated has scent blood after used restroom last week. Patient denies vaginal discharge, cramping, pain w/ urination, or fever and chills   Routine prenatal care:  Dating reviewed, dating tab is correct Fetal heart tones Appropriate Fundal height within expected range.  Per US  on 10/7 Infant feeding choice: Breastfeeding Contraception choice: Pills  Infant circumcision desired no  The patient does not have a history of Cesarean delivery and no referral to Center for [facility] Health is indicated Influenza vaccine administered today.   Tdap was given today. 1 hour glucola, CBC, RPR, and HIV were obtained today.    Rh status was reviewed and patient does not need Rhogam.  Rhogam was not given today.  Pregnancy medical home and PHQ-9 forms were done today and reviewed.   Childbirth and education classes were not offered. Pregnancy education regarding benefits of breastfeeding, contraception, fetal growth, expected weight gain, and safe infant sleep were discussed.  Preterm labor and fetal movement precautions reviewed.  2. Pregnancy issues include the following and were addressed as appropriate today:   Denies any issue with her pregnancy  Problem list and pregnancy box updated: Yes.   [facility] scheduled in [facility] during third trimester on 04/14/24.  Follow up 2 weeks.

## 2024-03-31 NOTE — Patient Instructions (Signed)
 Please return for your next OB visit in 2 weeks  Recommending to visit Maternity Assessment Unit for NST monitoring due to hx of vaginal bleeding  Pregnancy Related Return Precautions The follow are signs/symptoms that are abnormal in pregnancy and may require further evaluation at the MAU in the [facility] & Children's Center at [facility]: You have cramping/contractions that do not go away with drinking water, especially if they are lasting 30 seconds to 1.5 minutes, coming and going every 5-10 minutes for an hour or more, or are getting stronger and you cannot walk or talk while having a contraction/cramp. Your water breaks.  Sometimes it is a big gush of fluid, sometimes it is just a trickle that keeps getting your underwear wet or running down your legs You have vaginal bleeding.    You are beyond 22 weeks and do not feel your baby moving like normal.  If you do not, get something to eat and drink (something cold or something with sugar like peanut butter or juice) and lay down and focus on feeling your baby move. If your baby is still not moving like normal, you should go to MAU. You should feel your baby move 6 times in one hour, or 10 times in two hours. You have a persistent headache that does not go away with 1000mg  of Tylenol , is accompanied by vision changes, chest pain, difficulty breathing, severe pain in your right upper abdomen, or worsening leg swelling- these can all be signs of high blood pressure in pregnancy and need to be evaluated by a provider immediately  For any pregnancy-related emergencies, please go to the Maternity Admissions Unit in the Women's & Children's Center at [facility]. You will use hospital Entrance C.    Preparing for Baby: Buy a rear-facing car seat. Learn how to install it in your car. Set up a safe place for baby to sleep - this should be a bassinet or crib where baby will sleep alone on their back without pillows, blankets, or stuffed animals. If  you are planning to breastfeed, I encourage you to attend a breastfeeding class.

## 2024-03-31 NOTE — Discharge Instructions (Signed)
 It was a pleasure taking care of you today.  Your prior bleeding was likely from your cervix and I can currently see places that are very irritated and likely to bleed.  I recommend pelvic rest for the next 2 weeks.  If you start bleeding like a period or have any other concerns please return.  Hope you have a great rest of your day!

## 2024-03-31 NOTE — MAU Provider Note (Addendum)
 History     CSN: [id number]  Arrival date and time: 03/31/24 1032   None     Chief Complaint  Patient presents with   fetal monitoring   Belinda Fisher is a 28 year old G2P1001 female at [redacted]w[redacted]d gestation presenting with a history of mild vaginal bleeding last week. She describes the bleeding as bright pink with a liquid consistency that was only present on the toilet paper after wiping. Patient explains that she has had similar episodes twice in the past few months. She is sexually active and says that the episodes do not correlate with intercourse. She denies heavy bleeding, clots, abdominal pain, cramping, or vaginal discharge.  Vaginal Bleeding The patient's pertinent negatives include no pelvic pain or vaginal discharge. Pertinent negatives include no abdominal pain, nausea or vomiting.    OB History     Gravida  2   Para  1   Term  1   Preterm      AB      Living  1      SAB      IAB      Ectopic      Multiple      Live Births  1           Past Medical History:  Diagnosis Date   Medical history non-contributory     Past Surgical History:  Procedure Laterality Date   NO PAST SURGERIES      Family History  Problem Relation Age of Onset   Healthy Mother    Healthy Father    Asthma Neg Hx    Cancer Neg Hx    Diabetes Neg Hx    Heart disease Neg Hx    Hypertension Neg Hx     Social History   Tobacco Use   Smoking status: Never   Smokeless tobacco: Never  Vaping Use   Vaping status: Never Used  Substance Use Topics   Alcohol use: No   Drug use: No    Allergies: No Known Allergies  Medications Prior to Admission  Medication Sig Dispense Refill Last Dose/Taking   Prenatal Vit-Fe Fumarate-FA (PRENATAL VITAMIN) 27-0.8 MG TABS Take 1 tablet by mouth daily. 180 tablet 0 03/30/2024    Review of Systems  Gastrointestinal:  Negative for abdominal pain, nausea and vomiting.  Genitourinary:  Positive for vaginal bleeding.  Negative for pelvic pain, vaginal discharge and vaginal pain.   Physical Exam   Blood pressure (!) 94/57, pulse 70, temperature 98.2 F (36.8 C), temperature source Oral, resp. rate 15, height 4' 11 (1.499 m), weight 57.3 kg, last menstrual period 09/22/2023, SpO2 100%.  Physical Exam Constitutional:      General: She is not in acute distress.    Appearance: She is not ill-appearing.  Genitourinary:    Vagina: Vaginal discharge present.     Comments: On speculum exam, cervix appeared erythematous with discharge, friability, and a closed external os. Neurological:     Mental Status: She is alert and oriented to person, place, and time.   NST - Base rate: 135bpm Moderate variability with accelerations noted No decelerations No contractions appreciated  MAU Course  Procedures  MDM  Periodic mild vaginal bleeding over the course of a few months. Friability of cervix with discharge and closed external os on exam. Reassuring NST  Assessment and Plan  Cervicitis  Recommend pelvic rest for the next 2 weeks. Educated patient that she may experience spotting following exam today. Instructed patient to return if she  experiences heavy vaginal bleeding or pain.   [name] 03/31/2024, 11:38 AM   Attending Attestation  I saw and evaluated the patient, performing the key elements of the service.I  personally performed or re-performed the history, physical exam, and medical decision making activities of this service and have verified that the service and findings are accurately documented in the student's note. I developed the management plan that is described in the student's note, and I agree with the content, with my edits above.    Steffan Rover, MD Attending Family Medicine Physician, [facility] for [facility], [facility]

## 2024-03-31 NOTE — MAU Note (Signed)
 Belinda Fisher is a 28 y.o. at [redacted]w[redacted]d here in MAU reporting: she went for her routine visit today and mentioned she had some vaginal bleeding last week and was sent over for monitoring. Reports bleeding was one day last week and only on the tissue when she wiped in the am. Denies pain. Reports positive fetal movement.   Onset of complaint: today Pain score: 0 There were no vitals filed for this visit.   FHT: 150  Lab orders placed from triage: none

## 2024-04-01 ENCOUNTER — Ambulatory Visit: Payer: Self-pay | Admitting: Family Medicine

## 2024-04-01 LAB — GC/CHLAMYDIA PROBE AMP (~~LOC~~) NOT AT ARMC
Chlamydia: NEGATIVE
Comment: NEGATIVE
Comment: NORMAL
Neisseria Gonorrhea: NEGATIVE

## 2024-04-03 LAB — HGB FRACTIONATION CASCADE
Hgb A2: 2.8 % (ref 1.8–3.2)
Hgb A: 97.2 % (ref 96.4–98.8)
Hgb F: 0 % (ref 0.0–2.0)
Hgb S: 0 %

## 2024-04-03 LAB — CBC
Hematocrit: 35.6 % (ref 34.0–46.6)
Hemoglobin: 11.6 g/dL (ref 11.1–15.9)
MCH: 30.4 pg (ref 26.6–33.0)
MCHC: 32.6 g/dL (ref 31.5–35.7)
MCV: 93 fL (ref 79–97)
Platelets: 231 x10E3/uL (ref 150–450)
RBC: 3.81 x10E6/uL (ref 3.77–5.28)
RDW: 12.5 % (ref 11.7–15.4)
WBC: 12.6 x10E3/uL — ABNORMAL HIGH (ref 3.4–10.8)

## 2024-04-03 LAB — RPR: RPR Ser Ql: NONREACTIVE

## 2024-04-03 LAB — HIV ANTIBODY (ROUTINE TESTING W REFLEX): HIV Screen 4th Generation wRfx: NONREACTIVE

## 2024-04-04 ENCOUNTER — Telehealth: Payer: Self-pay | Admitting: Family Medicine

## 2024-04-04 NOTE — Telephone Encounter (Signed)
 Called pt and left VM discussing all lab work normal, including blood work done in clinic and swabs at MAU. Left clinic number to call back if any questions.  Rollene Keeling MD

## 2024-04-14 ENCOUNTER — Ambulatory Visit (INDEPENDENT_AMBULATORY_CARE_PROVIDER_SITE_OTHER): Payer: Self-pay

## 2024-04-14 VITALS — BP 115/74 | HR 87 | Wt 129.8 lb

## 2024-04-14 DIAGNOSIS — Z3493 Encounter for supervision of normal pregnancy, unspecified, third trimester: Secondary | ICD-10-CM

## 2024-04-14 MED ORDER — RSV PRE-FUSION F A&B VAC RCMB 120 MCG/0.5ML IM SOLR: 0.5000 mL | Freq: Once | INTRAMUSCULAR | 0 refills | Status: AC

## 2024-04-14 NOTE — Progress Notes (Signed)
  [facility] Prenatal Visit  Belinda Fisher is a 28 y.o. G2P1001 at [redacted]w[redacted]d here for routine follow up. She is dated by LMP.  She reports no complaints. She reports fetal movement. She denies vaginal bleeding, contractions, or loss of fluid. See flow sheet for details.  Vitals:   04/14/24 1335  BP: 115/74  Pulse: 87    Primary Prenatal Care Provider: Dr Alena Morrison   Postpartum Plans: - delivery planning: SVD - circumcision: declines - feeding: breast - pediatrician: [facility] versus [facility] - contraception: POP   FHR:  150 bpm Uterine size:  28 cm  A/P: Pregnancy at [redacted]w[redacted]d.  Doing well.   Routine prenatal care:  Dating reviewed, dating tab is correct Fetal heart tones Appropriate Fundal height within expected range.  Infant feeding choice: Breastfeeding Contraception choice: Undecided  Infant circumcision desired no  The patient does not have a history of Cesarean delivery and no referral to Center for [facility] Health is indicated Influenza vaccine previously administered.   Tdap was not given today. Received during last visit 1 hour glucola, CBC, RPR, and HIV were not obtained today.  Received during last visit  Rh status was reviewed and patient does not need Rhogam.  Rhogam was not given today.  Pregnancy medical home and PHQ-9 forms were not done today and reviewed.   Childbirth and education classes were offered. Pregnancy education regarding benefits of breastfeeding, contraception, fetal growth, expected weight gain, and safe infant sleep were discussed.  Preterm labor and fetal movement precautions reviewed.  2. Pregnancy issues include the following and were addressed as appropriate today:  - No complaint. She is doing well. Feeling the fetal movement and kicking  - Provided patient w/ RSV prescription which she can get it after November 11,2025   Problem list and pregnancy box updated: Yes.   Patient scheduled in [facility] during  third trimester on 05/13/24 with Dr. Donzetta.  Follow up 2 weeks.

## 2024-04-14 NOTE — Patient Instructions (Signed)
   It was great to see you!  Our plans for today:  - Provided prescription for RSV - can be getting after 05/03/24 ( 34wk) - f/u in 2 weeks  Take care and seek immediate care sooner if you develop any concerns.       Houston Coralee HAS PGY 1 Family Medicine Resident [facility]  [address] [place], [place] [address] Fax [phone] Phone [phone] 04/14/2024, 1:36 PM

## 2024-05-05 NOTE — Progress Notes (Unsigned)
  [facility] Prenatal Visit  Belinda Fisher is a 28 y.o. G2P1001 at [redacted]w[redacted]d here for routine follow up. She is dated by LMP.  She reports {symptoms; pregnancy related:14538}.  She reports fetal movement. She denies vaginal bleeding, contractions, or loss of fluid.  See flow sheet for details.  There were no vitals filed for this visit.   A/P: Pregnancy at [redacted]w[redacted]d.  Doing well.   Routine prenatal care:  Dating reviewed, dating tab is correct Fetal heart tones: {appropriate:23337} Fundal height: {fundal height:23342::within expected range. } The patient does not have a history of HSV and valacyclovir is not indicated at this time. *** The patient does not have a history of Cesarean delivery and no referral to Center for [facility] Health is indicated Infant feeding choice: Breastfeeding Contraception choice: Pills and condoms Infant circumcision desired {Response; yes/no/na:63} Influenza vaccine previously administered.   Tdap was not given today, administered on 03/31/24 Abrysvo RSV vaccine was discussed today (***if patient is between 32.0 to 36.6 WGA from Sept - Jan), and *** COVID vaccination was discussed and ***.  Childbirth and education classes were not offered, previously declined. Pregnancy education regarding benefits of breastfeeding, contraception, fetal growth, expected weight gain, and safe infant sleep were discussed.  Preterm labor and fetal movement precautions reviewed.   2. Pregnancy issues include the following and were addressed as appropriate today: Problem list and pregnancy box updated: Yes.   Scheduled for [facility] in third trimester on 05/13/24 with Dr. Donzetta.

## 2024-05-06 ENCOUNTER — Ambulatory Visit (INDEPENDENT_AMBULATORY_CARE_PROVIDER_SITE_OTHER): Payer: Self-pay | Admitting: Family Medicine

## 2024-05-06 VITALS — BP 101/69 | HR 95 | Wt 135.4 lb

## 2024-05-06 DIAGNOSIS — Z3493 Encounter for supervision of normal pregnancy, unspecified, third trimester: Secondary | ICD-10-CM

## 2024-05-06 NOTE — Patient Instructions (Addendum)
 Please return for your next OB visit in 1 week  RSV Vaccine  RSV is a common and highly contagious respiratory virus that can make your baby sick. - RSV is the #1 reason why babies are hospitalized, especially if they are under 3 months old - Almost half of RSV-related hospitalizations in babies happen in the first 3 months of life - Newborns are more vulnerable to RSV because their immune systems are still developing - We recommend you get the RSV vaccine to protect your baby, they will also be offered the vaccine if you decline during pregnancy.  Pregnancy Related Return Precautions The follow are signs/symptoms that are abnormal in pregnancy and may require further evaluation at the MAU in the [facility] at [facility]: You have cramping/contractions that do not go away with drinking water, especially if they are lasting 30 seconds to 1.5 minutes, coming and going every 5-10 minutes for an hour or more, or are getting stronger and you cannot walk or talk while having a contraction/cramp. Your water breaks.  Sometimes it is a big gush of fluid, sometimes it is just a trickle that keeps getting your underwear wet or running down your legs You have vaginal bleeding.    You are beyond 22 weeks and do not feel your baby moving like normal.  If you do not, get something to eat and drink (something cold or something with sugar like peanut butter or juice) and lay down and focus on feeling your baby move. If your baby is still not moving like normal, you should go to MAU. You should feel your baby move 6 times in one hour, or 10 times in two hours. You have a persistent headache that does not go away with 1000mg  of Tylenol , is accompanied by vision changes, chest pain, difficulty breathing, severe pain in your right upper abdomen, or worsening leg swelling- these can all be signs of high blood pressure in pregnancy and need to be evaluated by a provider immediately  For any  pregnancy-related emergencies, please go to the Maternity Admissions Unit in the [facility] at [facility]. You will use hospital Entrance C.    Safe Medications in Pregnancy   Acne:  Benzoyl Peroxide  Salicylic Acid   Backache/Headache:  Tylenol : 2 regular strength every 4 hours OR               2 extra strength every 6 hours  ** Do not exceed 4000 mg of tylenol  in 24 hours   Colds/Coughs/Allergies:  Benadryl  (alcohol free) 25 mg every 6 hours as needed  Breath right strips  Claritin  Cepacol throat lozenges  Chloraseptic throat spray  Cold-Eeze- up to three times per day  Cough drops, alcohol free  Flonase (by prescription only)  Guaifenesin  Mucinex  Robitussin DM (plain only, alcohol free)  Saline nasal spray/drops  Sudafed (pseudoephedrine) & Actifed * use only after [redacted] weeks gestation and if you do not have high blood pressure  Tylenol   Vicks Vaporub  Zinc lozenges  Zyrtec   Constipation:  Colace  Ducolax suppositories  Fleet enema  Glycerin  suppositories  Metamucil  Milk of magnesia  Miralax  Senokot  Smooth move tea   Diarrhea:  Kaopectate  Imodium A-D  **NO pepto Bismol   Hemorrhoids:  Anusol  Anusol HC  Preparation H  Tucks   Indigestion:  Tums  Maalox  Mylanta  Zantac  Pepcid   Insomnia:  Benadryl  (alcohol free) 25mg   every 6 hours as needed  Tylenol  PM  Unisom, no Gelcaps   Leg Cramps:  Tums  MagGel   Nausea/Vomiting:  Bonine  Dramamine  Emetrol  Ginger extract  Sea bands  Meclizine  Nausea medication to take during pregnancy:  Unisom (doxylamine succinate 25 mg tablets) Take one tablet daily at bedtime. If symptoms are not adequately controlled, the dose can be increased to a maximum recommended dose of two tablets daily (1/2 tablet in the morning, 1/2 tablet mid-afternoon and one at bedtime).  Vitamin B6 100mg  tablets. Take one tablet twice a day (up to 200 mg per day).   Skin Rashes:  Aveeno  products  Benadryl  cream or 25mg  every 6 hours as needed  Calamine Lotion  1% cortisone cream   Yeast infection:  Gyne-lotrimin 7  Monistat 7   **If taking multiple medications, please check labels to avoid duplicating the same active ingredients  **Take medications as directed on the label  **Do not take medications that contain ibuprofen  or aspirin without discussing with your physician  Preparing for Baby: Buy a rear-facing car seat. Learn how to install it in your car. Set up a safe place for baby to sleep - this should be a bassinet or crib where baby will sleep alone on their back without pillows, blankets, or stuffed animals. If you are planning to breastfeed, I encourage you to attend a breastfeeding class.

## 2024-05-13 ENCOUNTER — Ambulatory Visit (INDEPENDENT_AMBULATORY_CARE_PROVIDER_SITE_OTHER): Payer: Self-pay | Admitting: Family Medicine

## 2024-05-13 VITALS — BP 104/65 | HR 79 | Wt 137.0 lb

## 2024-05-13 DIAGNOSIS — O3660X0 Maternal care for excessive fetal growth, unspecified trimester, not applicable or unspecified: Secondary | ICD-10-CM

## 2024-05-13 DIAGNOSIS — Z3A33 33 weeks gestation of pregnancy: Secondary | ICD-10-CM

## 2024-05-13 NOTE — Progress Notes (Signed)
 [facility] Faculty OB Clinic Visit  Belinda Fisher is a 28 y.o. G2P1001 at [redacted]w[redacted]d (via LMP) who presents for faculty OB follow up. Prenatal course, history, notes, ultrasounds, and laboratory results reviewed.  Denies cramping/ctx, fluid leaking, vaginal bleeding, or decreased fetal movement. Taking PNV.    Primary Prenatal Care Provider: Dr Alena Morrison  Vitals:   05/13/24 1129  BP: 104/65  Pulse: 79     Postpartum Plans: - delivery planning: SVD - circumcision: declines - feeding: breast - pediatrician: [facility] vs [facility] - contraception: Nexplanon -  FHR: 147 bpm  Uterine size:  33 cm  Assessment & Plan  1. Routine prenatal care: - received Tdap and influenza vaccines 03/31/24 - discussed RSV vaccine - planning for nirsevimab for baby after delivery  2. LGA infant- EFS 90th%ile on most recent US . Passed 1 hr GTT. follow up growth US  scheduled with MFM on 06/02/24.    Next prenatal visit in 1 weeks . Labor & fetal movement precautions discussed.  Belinda Keeling, MD [facility] Faculty

## 2024-05-13 NOTE — Patient Instructions (Signed)
Please make a follow up appointment in 1 weeks.  Prenatal Classes Go to [email or web address] for more information on the pregnancy and child birth classes that [place] has to offer.   Pregnancy Related Return Precautions The follow are signs/symptoms that are abnormal in pregnancy and may require further evaluation by a physician: Go to the MAU at [facility] at [place] if: You have cramping/contractions that do not go away with drinking water, especially if they are lasting 30 seconds to 1.5 minutes, coming and going every 5-10 minutes for an hour or more, or are getting stronger and you cannot walk or talk while having a contraction/cramp. Your water breaks.  Sometimes it is a big gush of fluid, sometimes it is just a trickle that keeps getting your underwear wet or running down your legs You have vaginal bleeding.    You do not feel your baby moving like normal.  If you do not, get something to eat and drink (something cold or something with sugar like peanut butter or juice) and lay down and focus on feeling your baby move. If your baby is still not moving like normal, you should go to MAU. You should feel your baby move 6 times in one hour, or 10 times in two hours. You have a persistent headache that does not go away with 1 g of Tylenol, vision changes, chest pain, difficulty breathing, severe pain in your right upper abdomen, worsening leg swelling- these can all be signs of high blood pressure in pregnancy and need to be evaluated by a provider immediately  These are all concerning in pregnancy and if you have any of these I recommend you call your PCP and present to the Maternity Admissions Unit (map below) for further evaluation.  For any pregnancy-related emergencies, please go to the Maternity Admissions Unit in the [facility] Center at [facility]. You will use hospital Entrance C.    Our clinic number is [phone].   Dr Joquan Lotz  

## 2024-05-18 ENCOUNTER — Ambulatory Visit: Payer: Self-pay | Admitting: Family Medicine

## 2024-05-18 VITALS — BP 103/62 | HR 69 | Temp 98.5°F | Wt 136.4 lb

## 2024-05-18 DIAGNOSIS — Z3493 Encounter for supervision of normal pregnancy, unspecified, third trimester: Secondary | ICD-10-CM

## 2024-05-18 NOTE — Patient Instructions (Signed)
 You are doing great! Your you have an ultrasound on 12/11 and have a return follow up with us  in about 2 weeks and then again a week later. The appointments are on the next page.   Prenatal Classes Go to [email or web address] for more information on the pregnancy and child birth classes that [place] has to offer.   Financial Assistance To enroll in [facility], please call [phone] Ask to speak with Eric. He speaks Spanish.  Eric is located at: [address] [place], [place]  His office is open Tuesday, Wednesday, Thursday from 8:30a to 4:30p (Closed Monday & Friday)  Vaccinations If you are with the Adopt a Mom Program and need vaccines, these are done the [facility] on [address] [place]. The number to call to schedule an appt is [phone]  Pregnancy Related Return Precautions The follow are signs/symptoms that are abnormal in pregnancy and may require further evaluation by a physician: Go to the MAU at [facility] at [facility] if: You have cramping/contractions that do not go away with drinking water, especially if they are lasting 30 seconds to 1.5 minutes, coming and going every 5-10 minutes for an hour or more, or are getting stronger and you cannot walk or talk while having a contraction/cramp. Your water breaks.  Sometimes it is a big gush of fluid, sometimes it is just a trickle that keeps getting your underwear wet or running down your legs You have vaginal bleeding.    You do not feel your baby moving like normal.  If you do not, get something to eat and drink (something cold or something with sugar like peanut butter or juice) and lay down and focus on feeling your baby move. If your baby is still not moving like normal, you should go to MAU. You should feel your baby move 6 times in one hour, or 10 times in two hours. You have a persistent headache that  does not go away with 1 g of Tylenol , vision changes, chest pain, difficulty breathing, severe pain in your right upper abdomen, worsening leg swelling- these can all be signs of high blood pressure in pregnancy and need to be evaluated by a provider immediately  These are all concerning in pregnancy and if you have any of these I recommend you call your PCP and present to the Maternity Admissions Unit (map below) for further evaluation.  For any pregnancy-related emergencies, please go to the Maternity Admissions Unit in the [facility] Center at [facility]. You will use hospital Entrance C.    Our clinic number is [phone].

## 2024-05-18 NOTE — Progress Notes (Signed)
  [facility] Prenatal Visit  Belinda Fisher is a 28 y.o. G2P1001 at [redacted]w[redacted]d here for routine follow up. She is dated by LMP.  She reports no complaints.  She reports fetal movement. She denies vaginal bleeding, contractions, or loss of fluid.  See flow sheet for details.  Vitals:   05/18/24 1116  BP: 103/62  Pulse: 69  Temp: 98.5 F (36.9 C)     A/P: Pregnancy at [redacted]w[redacted]d.  Doing well.   Routine prenatal care:  Dating reviewed, dating tab is correct Fetal heart tones: Appropriate Fundal height: within expected range.  The patient does not have a history of HSV and valacyclovir is not indicated at this time.  The patient does not have a history of Cesarean delivery and no referral to Center for [facility] is indicated Infant feeding choice: Breastfeeding Contraception choice: Nexplanon   Infant circumcision desired no Influenza vaccine previously administered.   Tdap was not given today. Previously received 03/31/2024 Abrysvo RSV vaccine was discussed today, patient elected for baby to get nirsevimab after birth.  COVID vaccination was discussed and patient declined.  Childbirth and education classes were offered. Patient part of adopt a mom and going to classes through that.  Pregnancy education regarding benefits of breastfeeding, contraception, fetal growth, expected weight gain, and safe infant sleep were discussed.  Preterm labor and fetal movement precautions reviewed.   2. Pregnancy issues include the following and were addressed as appropriate today:  LGA infant- EFS 90th%ile on most recent US . Passed 1 hr GTT. follow up growth US  scheduled with MFM on 06/02/24.   Problem list and pregnancy box updated: Yes.    Follow up 2 weeks.

## 2024-06-02 ENCOUNTER — Ambulatory Visit: Payer: Self-pay | Attending: Obstetrics

## 2024-06-02 ENCOUNTER — Ambulatory Visit: Payer: Self-pay

## 2024-06-02 ENCOUNTER — Ambulatory Visit: Payer: Self-pay | Admitting: Obstetrics and Gynecology

## 2024-06-02 VITALS — BP 110/63 | HR 71

## 2024-06-02 DIAGNOSIS — Z3A36 36 weeks gestation of pregnancy: Secondary | ICD-10-CM | POA: Insufficient documentation

## 2024-06-02 DIAGNOSIS — O3660X0 Maternal care for excessive fetal growth, unspecified trimester, not applicable or unspecified: Secondary | ICD-10-CM | POA: Insufficient documentation

## 2024-06-02 DIAGNOSIS — O3663X0 Maternal care for excessive fetal growth, third trimester, not applicable or unspecified: Secondary | ICD-10-CM | POA: Insufficient documentation

## 2024-06-02 NOTE — Progress Notes (Signed)
 Maternal-Fetal Medicine Consultation  Name: Belinda Fisher  MRN: [id number]  GA: [id number] [redacted]w[redacted]d   Patient is here for fetal growth assessment.  On previous ultrasound, the estimated fetal weight was at the 90th percentile.  Ultrasound The estimated fetal weight is at the 90th percentile and the abdominal circumference measurement is at the 97th percentile.  Normal amniotic fluid.  Cephalic presentation.  I counseled the patient that ultrasound has limitations in accurately estimating fetal weights.  Her previous baby weighed 7 pounds at birth.  She does not have gestational diabetes.  I reassured the patient that in the absence of other risk factors, she can safely attempt vaginal delivery.  Recommendations No follow-up appointments were made     Consultation including face-to-face (more than 50%) counseling 10 minutes.

## 2024-06-03 ENCOUNTER — Ambulatory Visit: Payer: Self-pay | Admitting: Family Medicine

## 2024-06-03 ENCOUNTER — Other Ambulatory Visit (HOSPITAL_COMMUNITY)
Admission: RE | Admit: 2024-06-03 | Discharge: 2024-06-03 | Disposition: A | Payer: Self-pay | Source: Ambulatory Visit | Attending: Family Medicine | Admitting: Family Medicine

## 2024-06-03 VITALS — BP 96/65 | HR 69 | Temp 98.2°F | Wt 141.6 lb

## 2024-06-03 DIAGNOSIS — Z3A36 36 weeks gestation of pregnancy: Secondary | ICD-10-CM

## 2024-06-03 DIAGNOSIS — Z3493 Encounter for supervision of normal pregnancy, unspecified, third trimester: Secondary | ICD-10-CM

## 2024-06-03 DIAGNOSIS — O3660X0 Maternal care for excessive fetal growth, unspecified trimester, not applicable or unspecified: Secondary | ICD-10-CM

## 2024-06-03 NOTE — Progress Notes (Signed)
°  [facility] Prenatal Visit  Kavya Haag Romualdo is a 28 y.o. G2P1001 at [redacted]w[redacted]d here for routine follow up. She is dated by LMP.  She reports no complaints. She reports fetal movement. She denies vaginal bleeding, contractions, or loss of fluid. See flow sheet for details.  Vitals:   06/03/24 1450  BP: 96/65  Pulse: 69  Temp: 98.2 F (36.8 C)    A/P: Pregnancy at [redacted]w[redacted]d.  Doing well.   Routine prenatal care  Dating reviewed, dating tab is correct Fetal heart tones Appropriate Fundal height >2 cm from expected size given dating, discussed with preceptor.  Had her ultrasound for LGA yesterday. Consistent with 90% and was told she would be safe for vaginal delivery.  Fetal position confirmed Vertex using Ultrasound .  GBS collected today. .  Repeat GC/CT collected today.  The patient does not have a history of HSV and valacyclovir is not indicated at this time.  Infant feeding choice: Breastfeeding Contraception choice: Nexplanon  , Dr. Donzetta to look into financial support options for nexplanon  in the hospital post partum  Infant circumcision desired no Influenza vaccine previously administered.   Tdap previously administered between 27-36 weeks  COVID vaccination was discussed and declined.  BPP ordered between 40-41 weeks (aim for 40.1 WGA) to be scheduled when MFM office is open  Abrysvo RSV vaccine was discussed today patient will get nirsevimab for baby after he is born.  Pregnancy education regarding preterm labor, fetal movement,  benefits of breastfeeding, contraception, fetal growth, expected weight gain, and safe infant sleep were discussed.    2. Pregnancy issues include the following and were addressed as appropriate today:    LGA infant- [facility] 90th%ile continues on most recent US  yesterday. Passed 1 hr GTT. OB recommended still safe for vaginal delivery given ultrasound. Offered mom IOL at 39 weeks if she is interested and she will consider.   Problem list  and pregnancy box updated: Yes.   Follow up 1 week.

## 2024-06-03 NOTE — Assessment & Plan Note (Signed)
 LGA infant- [facility] 90th%ile continues on most recent US  yesterday. Passed 1 hr GTT. OB recommended still safe for vaginal delivery given ultrasound. Offered mom IOL at 39 weeks if she is interested and she will consider.

## 2024-06-03 NOTE — Patient Instructions (Addendum)
 I will look into the nexplanon  for you and let your doctor know for your next visit!   If you would like to be induced at 39 weeks you can think about it and let us  know at your next appointment.   We did a gonorrhea/chlamydia test today as well as for Group B strep a type of bacteria many women have on their skin.   Prenatal Classes Go to [email or web address] for more information on the pregnancy and child birth classes that [place] has to offer.   Financial Assistance To enroll in [facility], please call [phone] Ask to speak with Eric. He speaks Spanish.  Eric is located at: [address] [place], [place]  His office is open Tuesday, Wednesday, Thursday from 8:30a to 4:30p (Closed Monday & Friday)  Vaccinations If you are with the Adopt a Mom Program and need vaccines, these are done the [facility] on [address] [place]. The number to call to schedule an appt is [phone]  Pregnancy Related Return Precautions The follow are signs/symptoms that are abnormal in pregnancy and may require further evaluation by a physician: Go to the MAU at [facility] at [facility] if: You have cramping/contractions that do not go away with drinking water, especially if they are lasting 30 seconds to 1.5 minutes, coming and going every 5-10 minutes for an hour or more, or are getting stronger and you cannot walk or talk while having a contraction/cramp. Your water breaks.  Sometimes it is a big gush of fluid, sometimes it is just a trickle that keeps getting your underwear wet or running down your legs You have vaginal bleeding.    You do not feel your baby moving like normal.  If you do not, get something to eat and drink (something cold or something with sugar like peanut butter or juice) and lay down and focus on feeling your baby move. If your baby is still not moving like  normal, you should go to MAU. You should feel your baby move 6 times in one hour, or 10 times in two hours. You have a persistent headache that does not go away with 1 g of Tylenol , vision changes, chest pain, difficulty breathing, severe pain in your right upper abdomen, worsening leg swelling- these can all be signs of high blood pressure in pregnancy and need to be evaluated by a provider immediately  These are all concerning in pregnancy and if you have any of these I recommend you call your PCP and present to the Maternity Admissions Unit (map below) for further evaluation.  For any pregnancy-related emergencies, please go to the Maternity Admissions Unit in the Women's [facility] Children[facility] Center at [facility]. You will use hospital Entrance C.    Our clinic number is [phone].   Dr Donzetta

## 2024-06-06 ENCOUNTER — Ambulatory Visit: Payer: Self-pay | Admitting: Family Medicine

## 2024-06-06 LAB — CERVICOVAGINAL ANCILLARY ONLY
Chlamydia: NEGATIVE
Comment: NEGATIVE
Comment: NORMAL
Neisseria Gonorrhea: NEGATIVE

## 2024-06-07 LAB — CULTURE, BETA STREP (GROUP B ONLY): Strep Gp B Culture: NEGATIVE

## 2024-06-08 ENCOUNTER — Encounter (HOSPITAL_COMMUNITY): Payer: Self-pay | Admitting: Obstetrics and Gynecology

## 2024-06-08 ENCOUNTER — Inpatient Hospital Stay (HOSPITAL_COMMUNITY)
Admission: AD | Admit: 2024-06-08 | Discharge: 2024-06-09 | DRG: 806 | Disposition: A | Discharge status: Home / Self Care | Payer: MEDICAID | Attending: Obstetrics & Gynecology | Admitting: Obstetrics & Gynecology

## 2024-06-08 DIAGNOSIS — O9081 Anemia of the puerperium: Secondary | ICD-10-CM | POA: Diagnosis not present

## 2024-06-08 DIAGNOSIS — D62 Acute posthemorrhagic anemia: Secondary | ICD-10-CM | POA: Diagnosis not present

## 2024-06-08 DIAGNOSIS — O26893 Other specified pregnancy related conditions, third trimester: Secondary | ICD-10-CM | POA: Diagnosis present

## 2024-06-08 DIAGNOSIS — Z3A37 37 weeks gestation of pregnancy: Secondary | ICD-10-CM | POA: Diagnosis not present

## 2024-06-08 DIAGNOSIS — O3663X0 Maternal care for excessive fetal growth, third trimester, not applicable or unspecified: Principal | ICD-10-CM | POA: Diagnosis present

## 2024-06-08 DIAGNOSIS — O3660X0 Maternal care for excessive fetal growth, unspecified trimester, not applicable or unspecified: Secondary | ICD-10-CM | POA: Diagnosis present

## 2024-06-08 LAB — SYPHILIS: RPR W/REFLEX TO RPR TITER AND TREPONEMAL ANTIBODIES, TRADITIONAL SCREENING AND DIAGNOSIS ALGORITHM: RPR Ser Ql: NONREACTIVE

## 2024-06-08 LAB — TYPE AND SCREEN
ABO/RH(D): O POS
Antibody Screen: NEGATIVE

## 2024-06-08 LAB — CBC
HCT: 38.2 % (ref 36.0–46.0)
Hemoglobin: 12.5 g/dL (ref 12.0–15.0)
MCH: 29.3 pg (ref 26.0–34.0)
MCHC: 32.7 g/dL (ref 30.0–36.0)
MCV: 89.7 fL (ref 80.0–100.0)
Platelets: 226 K/uL (ref 150–400)
RBC: 4.26 MIL/uL (ref 3.87–5.11)
RDW: 12.4 % (ref 11.5–15.5)
WBC: 12.2 K/uL — ABNORMAL HIGH (ref 4.0–10.5)
nRBC: 0 % (ref 0.0–0.2)

## 2024-06-08 MED ORDER — FENTANYL CITRATE (PF) 100 MCG/2ML IJ SOLN
100.0000 ug | INTRAMUSCULAR | Status: DC | PRN
  Administered 2024-06-08: 10:00:00 100 ug via INTRAVENOUS

## 2024-06-08 MED ORDER — ONDANSETRON HCL 4 MG/2ML IJ SOLN: 4.0000 mg | Freq: Four times a day (QID) | INTRAMUSCULAR | Status: DC | PRN

## 2024-06-08 MED ORDER — COCONUT OIL OIL: 1.0000 | TOPICAL_OIL | Status: DC | PRN

## 2024-06-08 MED ORDER — OXYCODONE HCL 5 MG PO TABS: 5.0000 mg | ORAL_TABLET | ORAL | Status: DC | PRN

## 2024-06-08 MED ORDER — ACETAMINOPHEN 325 MG PO TABS: 650.0000 mg | ORAL_TABLET | ORAL | Status: DC | PRN

## 2024-06-08 MED ORDER — DIPHENHYDRAMINE HCL 25 MG PO CAPS: 25.0000 mg | ORAL_CAPSULE | Freq: Four times a day (QID) | ORAL | Status: DC | PRN

## 2024-06-08 MED ORDER — LACTATED RINGERS IV SOLN: 500.0000 mL | INTRAVENOUS | Status: DC | PRN

## 2024-06-08 MED ORDER — OXYCODONE-ACETAMINOPHEN 5-325 MG PO TABS: 2.0000 | ORAL_TABLET | ORAL | Status: DC | PRN

## 2024-06-08 MED ORDER — FENTANYL CITRATE (PF) 100 MCG/2ML IJ SOLN
INTRAMUSCULAR | Status: AC
  Filled 2024-06-08: qty 2

## 2024-06-08 MED ORDER — SOD CITRATE-CITRIC ACID 500-334 MG/5ML PO SOLN: 30.0000 mL | ORAL | Status: DC | PRN

## 2024-06-08 MED ORDER — ONDANSETRON HCL 4 MG/2ML IJ SOLN
INTRAMUSCULAR | Status: AC
  Filled 2024-06-08: qty 2

## 2024-06-08 MED ORDER — OXYTOCIN BOLUS FROM INFUSION
333.0000 mL | Freq: Once | INTRAVENOUS | Status: AC
  Administered 2024-06-08: 12:00:00 333 mL via INTRAVENOUS

## 2024-06-08 MED ORDER — ONDANSETRON HCL 4 MG PO TABS: 4.0000 mg | ORAL_TABLET | ORAL | Status: DC | PRN

## 2024-06-08 MED ORDER — SENNOSIDES-DOCUSATE SODIUM 8.6-50 MG PO TABS
2.0000 | ORAL_TABLET | ORAL | Status: DC
  Administered 2024-06-08: 18:00:00 2 via ORAL
  Filled 2024-06-08: qty 2

## 2024-06-08 MED ORDER — OXYTOCIN-SODIUM CHLORIDE 30-0.9 UT/500ML-% IV SOLN: 2.5000 [IU]/h | INTRAVENOUS | Status: DC

## 2024-06-08 MED ORDER — PRENATAL MULTIVITAMIN CH
1.0000 | ORAL_TABLET | Freq: Every day | ORAL | Status: DC
  Administered 2024-06-09: 11:00:00 1 via ORAL
  Filled 2024-06-08: qty 1

## 2024-06-08 MED ORDER — TETANUS-DIPHTH-ACELL PERTUSSIS 5-2-15.5 LF-MCG/0.5 IM SUSP: 0.5000 mL | Freq: Once | INTRAMUSCULAR | Status: DC

## 2024-06-08 MED ORDER — DIBUCAINE (PERIANAL) 1 % EX OINT: 1.0000 | TOPICAL_OINTMENT | CUTANEOUS | Status: DC | PRN

## 2024-06-08 MED ORDER — BENZOCAINE-MENTHOL 20-0.5 % EX AERO: 1.0000 | INHALATION_SPRAY | CUTANEOUS | Status: DC | PRN

## 2024-06-08 MED ORDER — OXYCODONE-ACETAMINOPHEN 5-325 MG PO TABS: 1.0000 | ORAL_TABLET | ORAL | Status: DC | PRN

## 2024-06-08 MED ORDER — FLEET ENEMA RE ENEM: 1.0000 | ENEMA | RECTAL | Status: DC | PRN

## 2024-06-08 MED ORDER — OXYCODONE HCL 5 MG PO TABS: 10.0000 mg | ORAL_TABLET | ORAL | Status: DC | PRN

## 2024-06-08 MED ORDER — LIDOCAINE HCL (PF) 1 % IJ SOLN: 30.0000 mL | INTRAMUSCULAR | Status: DC | PRN

## 2024-06-08 MED ORDER — ACETAMINOPHEN 325 MG PO TABS
650.0000 mg | ORAL_TABLET | ORAL | Status: DC | PRN
  Administered 2024-06-08: 13:00:00 650 mg via ORAL
  Filled 2024-06-08: qty 2

## 2024-06-08 MED ORDER — WITCH HAZEL-GLYCERIN EX PADS: 1.0000 | MEDICATED_PAD | CUTANEOUS | Status: DC | PRN

## 2024-06-08 MED ORDER — SIMETHICONE 80 MG PO CHEW: 80.0000 mg | CHEWABLE_TABLET | ORAL | Status: DC | PRN

## 2024-06-08 MED ORDER — MAGNESIUM HYDROXIDE 400 MG/5ML PO SUSP: 30.0000 mL | ORAL | Status: DC | PRN

## 2024-06-08 MED ORDER — ONDANSETRON HCL 4 MG/2ML IJ SOLN: 4.0000 mg | INTRAMUSCULAR | Status: DC | PRN

## 2024-06-08 MED ORDER — OXYTOCIN-SODIUM CHLORIDE 30-0.9 UT/500ML-% IV SOLN
INTRAVENOUS | Status: AC
  Filled 2024-06-08: qty 500

## 2024-06-08 MED ORDER — LACTATED RINGERS IV SOLN: INTRAVENOUS | Status: DC

## 2024-06-08 MED ORDER — IBUPROFEN 600 MG PO TABS
600.0000 mg | ORAL_TABLET | Freq: Four times a day (QID) | ORAL | Status: DC
  Administered 2024-06-08 – 2024-06-09 (×4): 600 mg via ORAL
  Filled 2024-06-08 (×4): qty 1

## 2024-06-08 MED ORDER — TRANEXAMIC ACID-NACL 1000-0.7 MG/100ML-% IV SOLN
INTRAVENOUS | Status: AC
  Filled 2024-06-08: qty 100

## 2024-06-08 NOTE — Progress Notes (Signed)
 28 you g2p1 @ 37+1 here for SOL with 90% EFW baby, contracting painfully, cat 1, risks/benefits of arom discussed and patient consents as non-medicated and wants to minimize time in labor, arom performed clear fluid cat 1 before and after. 8.5/90/-1 at time of arom.

## 2024-06-08 NOTE — Discharge Summary (Signed)
 Postpartum Discharge Summary  Date of Service updated***     Patient Name: Belinda Fisher DOB: 1995-07-04 MRN: [id number]  Date of admission: 06/08/2024 Delivery date:06/08/2024 Delivering provider: KANDIS ASA BEDFORD Date of discharge: 06/08/2024  Admitting diagnosis: Normal labor [O80, Z37.9] Intrauterine pregnancy: [redacted]w[redacted]d     Secondary diagnosis:  Active Problems:   Large for gestational age fetus affecting management of mother, antepartum   Normal labor  Additional problems:     Discharge diagnosis: Term Pregnancy Delivered                                              Post partum procedures:{Postpartum procedures:23558} Augmentation: AROM Complications: {OB Labor/Delivery Complications:20784}  Hospital course: Onset of Labor With Vaginal Delivery      28 y.o. yo G2P1001 at [redacted]w[redacted]d was admitted in Active Labor on 06/08/2024. Labor course was complicated by n/a  Membrane Rupture Time/Date: 8:50 AM,06/08/2024  Delivery Method:Vaginal, Spontaneous Operative Delivery:N/A Episiotomy: None Lacerations:  1st degree Patient had a postpartum course complicated by ***.  She is ambulating, tolerating a regular diet, passing flatus, and urinating well. Patient is discharged home in stable condition on 06/08/2024.  Newborn Data: Birth date:06/08/2024 Birth time:12:10 PM Gender:Female Living status:Living Apgars:8 ,9  Weight:   Magnesium  Sulfate received: No BMZ received: No Rhophylac:N/A MMR:N/A T-DaP:Given prenatally Flu: Given prenatally RSV Vaccine received: No Transfusion:{Transfusion received:30440034}  Immunizations received: Immunization History  Administered Date(s) Administered   Influenza, Seasonal, Injecte, Preservative Fre 03/31/2024   Influenza,inj,Quad PF,6+ Mos 05/20/2016   Tdap 03/31/2024    Physical exam  Vitals:   06/08/24 0515 06/08/24 0540 06/08/24 0624 06/08/24 0858  BP: 115/75 118/77 123/72   Pulse:  62 79   Resp:   20   Temp:   98.2 F  (36.8 C) 98 F (36.7 C)  TempSrc:   Oral Oral  SpO2:      Weight:      Height:       General: {Exam; general:21111117} Lochia: {Desc; appropriate/inappropriate:30686::appropriate} Uterine Fundus: {Desc; firm/soft:30687} Incision: {Exam; incision:21111123} DVT Evaluation: {Exam; dvt:2111122} Labs: Lab Results  Component Value Date   WBC 12.2 (H) 06/08/2024   HGB 12.5 06/08/2024   HCT 38.2 06/08/2024   MCV 89.7 06/08/2024   PLT 226 06/08/2024      Latest Ref Rng & Units 09/01/2016   10:21 PM  CMP  Glucose 65 - 99 mg/dL 93   BUN 6 - 20 mg/dL 12   Creatinine 9.55 - 1.00 mg/dL 9.37   Sodium 864 - 854 mmol/L 136   Potassium 3.5 - 5.1 mmol/L 4.1   Chloride 101 - 111 mmol/L 105   CO2 22 - 32 mmol/L 24   Calcium 8.9 - 10.3 mg/dL 8.9   Total Protein 6.5 - 8.1 g/dL 6.8   Total Bilirubin 0.3 - 1.2 mg/dL 0.4   Alkaline Phos 38 - 126 U/L 76   AST 15 - 41 U/L 20   ALT 14 - 54 U/L 15    Edinburgh Score:    06/08/2017    4:16 PM  Edinburgh Postnatal Depression Scale Screening Tool  I have been able to laugh and see the funny side of things. 0   I have looked forward with enjoyment to things. 0   I have blamed myself unnecessarily when things went wrong. 0   I have been anxious or worried for no  good reason. 0   I have felt scared or panicky for no good reason. 0   Things have been getting on top of me. 0   I have been so unhappy that I have had difficulty sleeping. 0   I have felt sad or miserable. 0   I have been so unhappy that I have been crying. 0   The thought of harming myself has occurred to me. 0   Edinburgh Postnatal Depression Scale Total 0      Data saved with a previous flowsheet row definition   No data recorded  After visit meds:  Allergies as of 06/08/2024   No Known Allergies   Med Rec must be completed prior to using this [facility]***        Discharge home in stable condition Infant Feeding: Breast Infant Disposition:home with  mother Discharge instruction: per After Visit Summary and Postpartum booklet. Activity: Advance as tolerated. Pelvic rest for 6 weeks.  Diet: routine diet Future Appointments: Future Appointments  Date Time Provider Department Center  06/09/2024  9:30 AM Madelon Donald CHRISTELLA ROSALEA [facility]  06/29/2024  3:15 PM [facility] PROVIDER 1 [facility]  06/29/2024  3:30 PM [facility] US1 [facility]   Follow up Visit:   Please schedule this patient for a In person postpartum visit in 4 weeks with the following provider: Any provider. Additional Postpartum F/U:n/a  Low risk pregnancy complicated by: n/a Delivery mode:  Vaginal, Spontaneous Anticipated Birth Control:  POPs and Condoms   06/08/2024 Donnice CHRISTELLA Carolus, MD

## 2024-06-08 NOTE — H&P (Signed)
 [name] is a 28 y.o. female G2P1001  at [redacted]w[redacted]d presenting for active labor at term.  Pregnancy is complicated by LGA in the absence of GDM.  US  MFM OB Follow Up on 06/02/24 at 36 weeks:  FHR observed, posterior placenta, cephalic position, AFI 20.24.  EFW 3341 g, 7lb 6 oz, 90%.    NURSING  PROVIDER  Office Location [facility] Dating by LMP US  pending  Adopt [employer] Mom? Yes Anatomy U/S  Incomplete 9/9, repeat scheduled for 10/7   Initiated care at  17wks                Language  English or Spanish              LAB RESULTS   Support Person  Genetics  Declined               Carrier Screen Horizon:  Rhogam  O/Positive/-- (07/29 1135) A1C/GTT Early:             Third trimester: passed  Flu Vaccine  03/31/2024    TDaP Vaccine  03/31/2024 Blood Type O/Positive/-- (07/29 1135)  Covid Vaccine  Antibody Negative (07/29 1135)  RSV Abrysvo Vax No will get nirsevimab Rubella 4.88 (07/29 1135)  Feeding Plan  Breast feed RPR Non Reactive (07/29 1135)  Contraception  POP + Condom HBsAg Negative (07/29 1135)  Circumcision  No HIV Non Reactive (07/29 1135)  Pediatrician   TAPM HCVAb Non Reactive (07/29 1135)  Prenatal Classes  Declined       Pap Diagnosis  Date Value Ref Range Status  01/19/2024   Final   - Negative for intraepithelial lesion or malignancy (NILM)    BTL Consent  GC/CT Initial:     Neg      36wks:  VBAC Consent  N/A GBS   For PCN allergy, check sensitivities   Aspirin indicated?  NO    DME Rx [ ]  BP cuff [ ]  Weight Scale Waterbirth  [ ]  Class [ ]  Consent [ ]  CNM visit  PHQ9 & GAD7 [ x] new OB [ x] 28 weeks  [ x] 36 weeks Induction  [ ]  Orders Entered    OB History     Gravida  2   Para  1   Term  1   Preterm      AB      Living  1      SAB      IAB      Ectopic      Multiple      Live Births  1          Past Medical History:  Diagnosis Date   Medical history non-contributory    Past Surgical History:  Procedure Laterality Date   NO PAST SURGERIES      Family History: family history includes Healthy in her father and mother. Social History:  reports that she has never smoked. She has never used smokeless tobacco. She reports that she does not drink alcohol and does not use drugs.     Maternal Diabetes: No Genetic Screening: Declined Maternal Ultrasounds/Referrals: Normal Fetal Ultrasounds or other Referrals:  None Maternal Substance Abuse:  No Significant Maternal Medications:  None Significant Maternal Lab Results:  Group B Strep negative Number of Prenatal Visits:greater than 3 verified prenatal visits Maternal Vaccinations:TDap and Flu Other Comments:  None  Review of Systems  Constitutional:  Negative for chills, fatigue and fever.  Eyes:  Negative for visual disturbance.  Respiratory:  Negative for shortness of breath.   Cardiovascular:  Negative for chest pain.  Gastrointestinal:  Positive for abdominal pain. Negative for vomiting.  Genitourinary:  Negative for difficulty urinating, dysuria, flank pain, pelvic pain, vaginal bleeding, vaginal discharge and vaginal pain.  Neurological:  Negative for dizziness and headaches.  Psychiatric/Behavioral: Negative.     Maternal Medical History:  Reason for admission: Contractions.   Contractions: Onset was 1-2 hours ago.   Frequency: regular.   Perceived severity is moderate.   Fetal activity: Perceived fetal activity is normal.   Last perceived fetal movement was within the past hour.   Prenatal complications: no prenatal complications Prenatal Complications - Diabetes: none.   Dilation: 7 Effacement (%): 80 Station: -2 Exam by:: oleh Loges RN Blood pressure 118/77, pulse 62, temperature 97.8 F (36.6 C), resp. rate 17, height 5' (1.524 m), weight 65.6 kg, last menstrual period 09/22/2023, SpO2 100%. Maternal Exam:  Uterine Assessment: Contraction strength is moderate.  Contraction frequency is regular.  Abdomen: Fetal presentation: vertex Cervix: Cervix  evaluated by digital exam.     Fetal Exam Fetal Monitor Review: Mode: ultrasound.   Baseline rate: 135.  Variability: moderate (6-25 bpm).   Pattern: accelerations present and no decelerations.   Fetal State Assessment: Category I - tracings are normal.   Physical Exam Vitals and nursing note reviewed.  Constitutional:      Appearance: She is well-developed.  Cardiovascular:     Rate and Rhythm: Normal rate.     Heart sounds: Normal heart sounds.  Pulmonary:     Effort: Pulmonary effort is normal.     Breath sounds: Normal breath sounds.  Abdominal:     Palpations: Abdomen is soft.  Musculoskeletal:        General: Normal range of motion.     Cervical back: Normal range of motion.  Skin:    General: Skin is warm and dry.  Neurological:     Mental Status: She is alert and oriented to person, place, and time.  Psychiatric:        Behavior: Behavior normal.        Thought Content: Thought content normal.        Judgment: Judgment normal.     Prenatal labs: ABO, Rh: --/--/PENDING (12/17 9394) Antibody: PENDING (12/17 9394) Rubella: 4.88 (07/29 1135) RPR: Non Reactive (10/09 1011)  HBsAg: Negative (07/29 1135)  HIV: Non Reactive (10/09 1011)  GBS: Negative/-- (12/12 1546)   Assessment/Plan: G2P1001 at [redacted]w[redacted]d admitted for active labor at term GBS negative LGA, EFW 90% at 36 weeks  Admit to L&D Expectant management Anticipate NSVD   Belinda Fisher 06/08/2024, 6:34 AM

## 2024-06-08 NOTE — Progress Notes (Deleted)
°  [facility] Prenatal Visit  Belinda Fisher is a 28 y.o. G2P1001 at [redacted]w[redacted]d here for routine follow up. She is dated by {Ob dating:14516}.  She reports {symptoms; pregnancy related:14538}. She reports fetal movement. She denies vaginal bleeding, contractions, or loss of fluid. See flow sheet for details.  There were no vitals filed for this visit.  A/P: Pregnancy at [redacted]w[redacted]d.  Doing well.   Routine prenatal care  Dating reviewed, dating tab is {correct:23336::correct} Fetal heart tones {appropriate:23337} Fundal height {fundal height:23342::within expected range. }. LGA this pregnancy, 90% and previously told would be safe for vaginal delivery. Fetal position confirmed {vertex:23350::Vertex} using {ultrasound or Leopolds:23351::Ultrasound }.  GBS previously collected, negative.  Repeat GC/CT previously collected, negative. The patient does not have a history of HSV and valacyclovir is not indicated at this time.  Infant feeding choice: {Breasfeeding:23347::Breastfeeding} Contraception choice: Nexplanon  , looking into financing options*** Infant circumcision desired no Influenza vaccine previously administered.   Tdap previously administered between 27-36 weeks  COVID vaccination was discussed and ***.  BPP scheduled between 40-41 weeks (aim for 40.1 WGA) on *** Abrysvo RSV vaccine was discussed today (***if patient is between 32.0 to 36.6 WGA from Sept - Jan), and *** Pregnancy education regarding preterm labor, fetal movement,  benefits of breastfeeding, contraception, fetal growth, expected weight gain, and safe infant sleep were discussed.    2. Pregnancy issues include the following and were addressed as appropriate today:   ***  Problem list and pregnancy box updated: {yes/no:20286::Yes}.  Follow up 1 week.

## 2024-06-08 NOTE — MAU Note (Signed)
 Belinda Fisher is a 28 y.o. at [redacted]w[redacted]d here in MAU reporting ctxs since 2300. She was able to sleep some but now they are closer and stronger. Denies LOF or VB. Reports good FM. No recent sve  LMP: na Onset of complaint: 2300 Pain score: 6 Vitals:   06/08/24 0512 06/08/24 0515  BP:  115/75  Pulse: 62   Resp: 17   Temp: 97.8 F (36.6 C)   SpO2: 100%      FHT: 135  Lab orders placed from triage: labor eval

## 2024-06-09 ENCOUNTER — Telehealth: Payer: Self-pay | Admitting: Family Medicine

## 2024-06-09 ENCOUNTER — Encounter: Payer: Self-pay | Admitting: Family Medicine

## 2024-06-09 LAB — CBC
HCT: 27.9 % — ABNORMAL LOW (ref 36.0–46.0)
Hemoglobin: 9.3 g/dL — ABNORMAL LOW (ref 12.0–15.0)
MCH: 29.7 pg (ref 26.0–34.0)
MCHC: 33.3 g/dL (ref 30.0–36.0)
MCV: 89.1 fL (ref 80.0–100.0)
Platelets: 190 K/uL (ref 150–400)
RBC: 3.13 MIL/uL — ABNORMAL LOW (ref 3.87–5.11)
RDW: 12.9 % (ref 11.5–15.5)
WBC: 15.4 K/uL — ABNORMAL HIGH (ref 4.0–10.5)
nRBC: 0 % (ref 0.0–0.2)

## 2024-06-09 LAB — BIRTH TISSUE RECOVERY COLLECTION (PLACENTA DONATION)

## 2024-06-09 MED ORDER — COCONUT OIL OIL: 1.0000 | TOPICAL_OIL | Status: AC | PRN

## 2024-06-09 MED ORDER — IBUPROFEN 600 MG PO TABS: 600.0000 mg | ORAL_TABLET | Freq: Four times a day (QID) | ORAL | 0 refills | Status: AC

## 2024-06-09 MED ORDER — DIBUCAINE (PERIANAL) 1 % EX OINT: 1.0000 | TOPICAL_OINTMENT | CUTANEOUS | Status: AC | PRN

## 2024-06-09 MED ORDER — WITCH HAZEL-GLYCERIN EX PADS: 1.0000 | MEDICATED_PAD | CUTANEOUS | 12 refills | Status: AC | PRN

## 2024-06-09 MED ORDER — SENNOSIDES-DOCUSATE SODIUM 8.6-50 MG PO TABS: 2.0000 | ORAL_TABLET | ORAL | 0 refills | Status: AC

## 2024-06-09 MED ORDER — BENZOCAINE-MENTHOL 20-0.5 % EX AERO: 1.0000 | INHALATION_SPRAY | CUTANEOUS | Status: AC | PRN

## 2024-06-09 MED ORDER — ACETAMINOPHEN 325 MG PO TABS: 650.0000 mg | ORAL_TABLET | ORAL | Status: AC | PRN

## 2024-06-09 NOTE — Telephone Encounter (Signed)
 Noted that patient has delivered. Unfortunately nexplanon  not covered in hospital by [facility].  Per Eric, can receive at [facility] Department for free after delivery.   Donald Lai, DO 06/09/2024 8:55 AM

## 2024-06-09 NOTE — Lactation Note (Signed)
 This note was copied from a baby's chart. Lactation Consultation Note  Patient Name: Belinda Fisher Unijb'd Date: 06/09/2024 Age:26 hours   Mom declined Lactation services.  Maternal Data    Feeding    LATCH Score                    Lactation Tools Discussed/Used    Interventions    Discharge    Consult Status Consult Status: Complete    Wreatha Sturgeon G 06/09/2024, 6:46 AM

## 2024-06-17 ENCOUNTER — Telehealth (HOSPITAL_COMMUNITY): Payer: Self-pay | Admitting: *Deleted

## 2024-06-17 NOTE — Telephone Encounter (Signed)
 06/17/2024  Name: Malaiah Viramontes MRN: [id number] DOB: Apr 09, 1996  Reason for Call:  Transition of Care Hospital Discharge Call  Contact Status: Patient Contact Status: Message  Language assistant needed:          Follow-Up Questions:    Van Postnatal Depression Scale:  In the Past 7 Days:    PHQ2-9 Depression Scale:     Discharge Follow-up:    Post-discharge interventions: NA  Mliss Sieve, RN 06/17/2024 13:48

## 2024-06-17 NOTE — Telephone Encounter (Signed)
 06/17/2024  Name: Belinda Fisher MRN: [id number] DOB: June 12, 1996  Reason for Call:  Transition of Care Hospital Discharge Call  Contact Status: Patient Contact Status: Complete  Language assistant needed: Interpreter Mode: Interpreter Not Needed        Follow-Up Questions: Do You Have Any Concerns About Your Health As You Heal From Delivery?: Yes What Concerns Do You Have About Your Health?: Patient said she is still having spotting and wonders if this is normal. Reviewed normals for lochia and parameters for calling provider. Do You Have Any Concerns About Your Infants Health?: Yes What Concerns Do You Have About Your Baby?: Baby's umbilical cord came off today and patient notes yellowish delicate skin at the site. Advised that this is normal, continue to keep the site clean and dry. She said they are seeing pediatrician next week and she plans to have them look at the site.  Edinburgh Postnatal Depression Scale:  In the Past 7 Days:    PHQ2-9 Depression Scale:     Discharge Follow-up: Edinburgh score requires follow up?:  (Patient says answers are the same as in the hospital when score was 0. Patient endorse she is doing well emotionally.) Patient was advised of the following resources:: Support Group, Breastfeeding Support Group (declines postpartum group information via email)  Post-discharge interventions: Reviewed Newborn Safe Sleep Practices  Mliss Sieve, RN 06/17/2024 14:17

## 2024-06-29 ENCOUNTER — Ambulatory Visit: Payer: Self-pay

## 2024-07-19 ENCOUNTER — Ambulatory Visit: Payer: Self-pay | Admitting: Family Medicine

## 2024-07-26 ENCOUNTER — Encounter: Payer: Self-pay | Admitting: Family Medicine

## 2024-07-26 ENCOUNTER — Ambulatory Visit: Payer: Self-pay | Admitting: Family Medicine
